# Patient Record
Sex: Female | Born: 2000 | Race: Black or African American | Hispanic: No | Marital: Single | Smoking: Former smoker
Health system: Southern US, Community
[De-identification: ages and names within clinical notes are randomized; demographics above are authoritative.]

## PROBLEM LIST (undated history)

## (undated) ENCOUNTER — Inpatient Hospital Stay (HOSPITAL_COMMUNITY): Payer: Self-pay

## (undated) VITALS — BP 97/50 | HR 102 | Temp 98.1°F | Resp 16 | Ht 65.04 in | Wt 133.6 lb

## (undated) DIAGNOSIS — R41840 Attention and concentration deficit: Secondary | ICD-10-CM

## (undated) DIAGNOSIS — F32A Depression, unspecified: Secondary | ICD-10-CM

## (undated) DIAGNOSIS — R51 Headache: Secondary | ICD-10-CM

## (undated) DIAGNOSIS — J45909 Unspecified asthma, uncomplicated: Secondary | ICD-10-CM

## (undated) DIAGNOSIS — Z3493 Encounter for supervision of normal pregnancy, unspecified, third trimester: Secondary | ICD-10-CM

## (undated) DIAGNOSIS — F329 Major depressive disorder, single episode, unspecified: Secondary | ICD-10-CM

## (undated) DIAGNOSIS — F319 Bipolar disorder, unspecified: Secondary | ICD-10-CM

## (undated) HISTORY — PX: TONSILLECTOMY: SUR1361

## (undated) HISTORY — PX: OTHER SURGICAL HISTORY: SHX169

---
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
## 2018-01-01 ENCOUNTER — Inpatient Hospital Stay (HOSPITAL_COMMUNITY)
Admission: AD | Admit: 2018-01-01 | Discharge: 2018-01-01 | Discharge status: Against Medical Advice | Payer: Medicaid Other | Source: Ambulatory Visit | Attending: Obstetrics and Gynecology | Admitting: Obstetrics and Gynecology

## 2018-01-01 DIAGNOSIS — R109 Unspecified abdominal pain: Secondary | ICD-10-CM | POA: Insufficient documentation

## 2018-01-01 DIAGNOSIS — Z5321 Procedure and treatment not carried out due to patient leaving prior to being seen by health care provider: Secondary | ICD-10-CM | POA: Diagnosis not present

## 2018-01-01 LAB — URINALYSIS, ROUTINE W REFLEX MICROSCOPIC
BACTERIA UA: NONE SEEN
Bilirubin Urine: NEGATIVE
Glucose, UA: NEGATIVE mg/dL
Hgb urine dipstick: NEGATIVE
Ketones, ur: NEGATIVE mg/dL
NITRITE: NEGATIVE
Protein, ur: NEGATIVE mg/dL
SPECIFIC GRAVITY, URINE: 1.021 (ref 1.005–1.030)
pH: 6 (ref 5.0–8.0)

## 2018-01-01 NOTE — MAU Note (Signed)
Pt called back to a room but was not in lobby

## 2018-01-01 NOTE — MAU Note (Signed)
Pt called, not in lobby 

## 2018-01-01 NOTE — MAU Note (Signed)
Abdominal pain for 2 days, worse today, dull ache, 6/10, intermittent  Some bloody discharge last night  Would like to be checked for STI  LMP 10/18/17

## 2018-01-24 ENCOUNTER — Telehealth (HOSPITAL_COMMUNITY): Payer: Self-pay

## 2018-01-24 ENCOUNTER — Encounter (HOSPITAL_COMMUNITY): Payer: Self-pay

## 2018-01-24 NOTE — Telephone Encounter (Signed)
Pt. Came in for proof of preg. letter

## 2018-03-01 NOTE — L&D Delivery Note (Signed)
Delivery Note At 3:27 PM a viable female was delivered via Vaginal, Spontaneous (Presentation: direct OA ).  APGAR: pending weight 5 lb 11.4 oz (2590 g).   Placenta status:spontaneous ,intact.  Clot on maternal side of placenta, fluid moderately blood tinged on deliver.  Cord:  with the following complications: none .  Cord pH: n/a  Anesthesia:   Episiotomy: None Lacerations: Labial, right,hemostatic Suture Repair: NOne Est. Blood Loss (mL): 100  Mom to postpartum.  Baby to NICU.  Baby skin to skin while awaiting delayed cord clamping. Baby was vigorous with a strong cry at delivery. Passed off to awaiting NICU team.  Geryl Rankins 06/20/2018, 3:51 PM

## 2018-03-03 ENCOUNTER — Telehealth: Payer: Self-pay | Admitting: Licensed Clinical Social Worker

## 2018-03-03 NOTE — Telephone Encounter (Signed)
Unable to leave voicemail.

## 2018-03-06 ENCOUNTER — Telehealth: Payer: Self-pay | Admitting: Licensed Clinical Social Worker

## 2018-03-06 NOTE — Telephone Encounter (Signed)
Unable to leave message

## 2018-03-14 ENCOUNTER — Telehealth: Payer: Self-pay | Admitting: Licensed Clinical Social Worker

## 2018-03-14 NOTE — Telephone Encounter (Signed)
Pt called. No message left. Voicemail unavailable.

## 2018-03-21 ENCOUNTER — Encounter: Payer: Medicaid Other | Admitting: Student

## 2018-04-03 ENCOUNTER — Telehealth: Payer: Self-pay | Admitting: Licensed Clinical Social Worker

## 2018-04-03 NOTE — Telephone Encounter (Signed)
Called pt regarding two no show appts unable to leave message

## 2018-05-01 ENCOUNTER — Other Ambulatory Visit: Payer: Self-pay

## 2018-05-01 ENCOUNTER — Inpatient Hospital Stay (HOSPITAL_COMMUNITY)
Admission: AD | Admit: 2018-05-01 | Discharge: 2018-05-01 | Disposition: A | Discharge status: Home / Self Care | Payer: Medicaid Other | Attending: Obstetrics and Gynecology | Admitting: Obstetrics and Gynecology

## 2018-05-01 ENCOUNTER — Encounter (HOSPITAL_COMMUNITY): Payer: Self-pay | Admitting: *Deleted

## 2018-05-01 DIAGNOSIS — B9689 Other specified bacterial agents as the cause of diseases classified elsewhere: Secondary | ICD-10-CM | POA: Insufficient documentation

## 2018-05-01 DIAGNOSIS — R6889 Other general symptoms and signs: Secondary | ICD-10-CM

## 2018-05-01 DIAGNOSIS — B379 Candidiasis, unspecified: Secondary | ICD-10-CM

## 2018-05-01 DIAGNOSIS — B373 Candidiasis of vulva and vagina: Secondary | ICD-10-CM | POA: Diagnosis not present

## 2018-05-01 DIAGNOSIS — Z87891 Personal history of nicotine dependence: Secondary | ICD-10-CM | POA: Diagnosis not present

## 2018-05-01 DIAGNOSIS — R824 Acetonuria: Secondary | ICD-10-CM | POA: Diagnosis not present

## 2018-05-01 DIAGNOSIS — O23592 Infection of other part of genital tract in pregnancy, second trimester: Secondary | ICD-10-CM | POA: Diagnosis not present

## 2018-05-01 DIAGNOSIS — O98812 Other maternal infectious and parasitic diseases complicating pregnancy, second trimester: Secondary | ICD-10-CM | POA: Diagnosis not present

## 2018-05-01 DIAGNOSIS — R05 Cough: Secondary | ICD-10-CM | POA: Diagnosis present

## 2018-05-01 DIAGNOSIS — O99512 Diseases of the respiratory system complicating pregnancy, second trimester: Secondary | ICD-10-CM | POA: Insufficient documentation

## 2018-05-01 DIAGNOSIS — O26892 Other specified pregnancy related conditions, second trimester: Secondary | ICD-10-CM

## 2018-05-01 DIAGNOSIS — Z3689 Encounter for other specified antenatal screening: Secondary | ICD-10-CM

## 2018-05-01 DIAGNOSIS — J101 Influenza due to other identified influenza virus with other respiratory manifestations: Secondary | ICD-10-CM | POA: Diagnosis not present

## 2018-05-01 DIAGNOSIS — Z3A26 26 weeks gestation of pregnancy: Secondary | ICD-10-CM | POA: Insufficient documentation

## 2018-05-01 DIAGNOSIS — N760 Acute vaginitis: Secondary | ICD-10-CM

## 2018-05-01 LAB — WET PREP, GENITAL
SPERM: NONE SEEN
Trich, Wet Prep: NONE SEEN

## 2018-05-01 LAB — URINALYSIS, ROUTINE W REFLEX MICROSCOPIC
BILIRUBIN URINE: NEGATIVE
GLUCOSE, UA: NEGATIVE mg/dL
HGB URINE DIPSTICK: NEGATIVE
Ketones, ur: 20 mg/dL — AB
Nitrite: NEGATIVE
PH: 6 (ref 5.0–8.0)
Protein, ur: NEGATIVE mg/dL
Specific Gravity, Urine: 1.019 (ref 1.005–1.030)

## 2018-05-01 LAB — INFLUENZA PANEL BY PCR (TYPE A & B)
Influenza A By PCR: POSITIVE — AB
Influenza B By PCR: NEGATIVE

## 2018-05-01 MED ORDER — SALINE SPRAY 0.65 % NA SOLN: 1.0000 | NASAL | 0 refills | Status: DC | PRN

## 2018-05-01 MED ORDER — TERCONAZOLE 0.4 % VA CREA: 1.0000 | TOPICAL_CREAM | Freq: Every day | VAGINAL | 0 refills | Status: DC

## 2018-05-01 MED ORDER — METRONIDAZOLE 500 MG PO TABS: 500.0000 mg | ORAL_TABLET | Freq: Two times a day (BID) | ORAL | 0 refills | Status: DC

## 2018-05-01 MED ORDER — LACTATED RINGERS IV BOLUS
1000.0000 mL | Freq: Once | INTRAVENOUS | Status: AC
  Administered 2018-05-01: 1000 mL via INTRAVENOUS

## 2018-05-01 MED ORDER — MENTHOL 3 MG MT LOZG
1.0000 | LOZENGE | OROMUCOSAL | Status: DC | PRN
  Administered 2018-05-01: 3 mg via ORAL
  Filled 2018-05-01: qty 9

## 2018-05-01 MED ORDER — ACETAMINOPHEN 325 MG PO TABS: 650.0000 mg | ORAL_TABLET | Freq: Four times a day (QID) | ORAL | 0 refills | Status: DC | PRN

## 2018-05-01 MED ORDER — ACETAMINOPHEN 500 MG PO TABS
1000.0000 mg | ORAL_TABLET | Freq: Once | ORAL | Status: AC
  Administered 2018-05-01: 1000 mg via ORAL
  Filled 2018-05-01: qty 2

## 2018-05-01 MED ORDER — ONDANSETRON HCL 4 MG PO TABS: 4.0000 mg | ORAL_TABLET | Freq: Three times a day (TID) | ORAL | 0 refills | Status: DC | PRN

## 2018-05-01 MED ORDER — ONDANSETRON 4 MG PO TBDP
4.0000 mg | ORAL_TABLET | Freq: Once | ORAL | Status: AC
  Administered 2018-05-01: 4 mg via ORAL
  Filled 2018-05-01: qty 1

## 2018-05-01 MED ORDER — MENTHOL 3 MG MT LOZG: 1.0000 | LOZENGE | OROMUCOSAL | 0 refills | Status: DC | PRN

## 2018-05-01 MED ORDER — OSELTAMIVIR PHOSPHATE 75 MG PO CAPS: 75.0000 mg | ORAL_CAPSULE | Freq: Two times a day (BID) | ORAL | 0 refills | Status: DC

## 2018-05-01 NOTE — MAU Note (Signed)
Pt reports she has been coughing since yesterday, body aches, sore throat, congestion.

## 2018-05-01 NOTE — MAU Provider Note (Signed)
History     CSN: [id number]  Arrival date and time: 05/01/18 1115   First Provider Initiated Contact with Patient 05/01/18 1208      Chief Complaint  Patient presents with  . Cough  . Sore Throat  . Generalized Body Aches  . Nasal Congestion   HPI Laura Callahan is a 18 y.o. [phone] at 2041w6d who presents to MAU with chief complaints of dry cough, body aches, sore throat and congestion. These are new problems, occurring together, onset last night. She did not receive a flu shot this season. She denies recent exposure to ill people.  Patient endorses generalized abdominal pain which coincides with episodes of coughing. She endorses pain rating of 8/10. She denies aggravating or alleviating factors. She has not taken medication or tried other treatments for this pain. She denies vaginal bleeding, leaking of fluid, decreased fetal movement, fever, falls, or recent illness.     OB History    Gravida  4   Para  1   Term  1   Preterm  0   AB  2   Living  1     SAB  2   TAB  0   Ectopic  0   Multiple  0   Live Births  1           Past Medical History:  Diagnosis Date  . Asthma   . Attention deficit   . Bipolar 1 disorder (HCC)   . Depression   . Headache(784.0)   . Normal pregnancy in third trimester 08/27/2016  . SVD (spontaneous vaginal delivery) 08/27/2016    Past Surgical History:  Procedure Laterality Date  . addenoidectomy    . TONSILLECTOMY      Family History  Problem Relation Age of Onset  . Depression Sister   . Depression Mother   . Anxiety disorder Mother   . Drug abuse Mother   . Anxiety disorder Sister   . Drug abuse Cousin     Social History   Tobacco Use  . Smoking status: Former Smoker    Packs/day: 0.00    Last attempt to quit: 12/02/2017    Years since quitting: 0.4  . Smokeless tobacco: Never Used  Substance Use Topics  . Alcohol use: No  . Drug use: Not Currently    Types: Marijuana    Comment: last used Jan 2020     Allergies:  Allergies  Allergen Reactions  . Flax Seed Oil [Bio-Flax] Swelling    blisters  . Clonidine Derivatives Nausea Only    Stomach pain  . Red Dye Nausea And Vomiting  . Codeine Nausea And Vomiting and Other (See Comments)    Blisters as a child- tolerates percocet and other pain medications without issue    Medications Prior to Admission  Medication Sig Dispense Refill Last Dose  . ibuprofen (ADVIL,MOTRIN) 600 MG tablet Take 1 tablet (600 mg total) by mouth every 6 (six) hours as needed. 40 tablet 1   . Prenatal Vit-Fe Fumarate-FA (PRENATAL MULTIVITAMIN) TABS tablet Take 1 tablet by mouth daily at 12 noon.   08/25/2016 at Unknown time  . prochlorperazine (COMPAZINE) 10 MG tablet Take 1 tablet (10 mg total) by mouth 2 (two) times daily as needed for nausea or vomiting. 20 tablet 0   . scopolamine (TRANSDERM-SCOP) 1 MG/3DAYS Place 1 patch (1.5 mg total) onto the skin every 3 (three) days. 10 patch 12     Review of Systems  Constitutional: Positive for fatigue.  Negative for chills and fever.  Respiratory: Positive for cough. Negative for shortness of breath.   Gastrointestinal: Positive for abdominal pain. Negative for nausea and vomiting.  Genitourinary: Negative for difficulty urinating, dyspareunia, dysuria, flank pain, vaginal bleeding, vaginal discharge and vaginal pain.  Musculoskeletal: Positive for myalgias.  Neurological: Negative for syncope, weakness and headaches.  All other systems reviewed and are negative.  Physical Exam   Blood pressure 113/75, pulse (!) 121, temperature 99.3 F (37.4 C), temperature source Oral, resp. rate 19, height  (1.676 m), weight 63 kg, last menstrual period 10/25/2017, SpO2 100 %, unknown if currently breastfeeding.  Physical Exam  Nursing note and vitals reviewed. Constitutional: She is oriented to person, place, and time. She appears well-developed and well-nourished.  Cardiovascular: Normal rate.  Respiratory: Effort  normal and breath sounds normal. No respiratory distress. She has no wheezes. She has no rales. She exhibits no tenderness.  GI: Soft. She exhibits no distension. There is no abdominal tenderness. There is no rebound, no guarding and no CVA tenderness.  Gravid  Genitourinary:    Uterus normal.  Cervix exhibits discharge. Cervix exhibits no motion tenderness and no friability.  Neurological: She is alert and oriented to person, place, and time.  Skin: Skin is warm and dry.  Psychiatric: She has a normal mood and affect. Her behavior is normal. Judgment and thought content normal.    MAU Course/MDM  Procedures: sterile speculum exam  --[facility] prenatal records scanned into media tab, dated 02/09/2018. Reviewed --Fetal tachycardia resolved with 1L IV fluid bolus.  --Now Category I: baseline 145, moderate variability, positive accelerations, no decels --Toco: brief periods of uterine irritability --Symptoms resolving with medications given in MAU. Abdominal pain rated as 3/10 at discharge  Patient Vitals for the past 24 hrs:  BP Temp Temp src Pulse Resp SpO2 Height Weight  05/01/18 1424 (!) 95/46 98.2 F (36.8 C) Oral 92 18 - - -  05/01/18 1147 113/75 99.3 F (37.4 C) Oral (!) 121 19 100 %  (1.676 m) 63 kg    Orders Placed This Encounter  Procedures  . Wet prep, genital    Standing Status:   Standing    Number of Occurrences:   1  . Culture, OB Urine    Standing Status:   Standing    Number of Occurrences:   1    Order Specific Question:   Patient immune status    Answer:   Normal  . Urinalysis, Routine w reflex microscopic    Standing Status:   Standing    Number of Occurrences:   1  . Influenza panel by PCR (type A & B)    Standing Status:   Standing    Number of Occurrences:   1  . Droplet Isolation    Standing Status:   Standing    Number of Occurrences:   1  . Insert peripheral IV    Standing Status:   Standing    Number of Occurrences:   1  . Discharge patient     Order Specific Question:   Discharge disposition    Answer:   01-Home or Self Care [1]    Order Specific Question:   Discharge patient date    Answer:   05/01/2018   Results for orders placed or performed during the hospital encounter of 05/01/18 (from the past 24 hour(s))  Urinalysis, Routine w reflex microscopic     Status: Abnormal   Collection Time: 05/01/18 12:13 PM  Result Value Ref Range   Color, Urine YELLOW YELLOW   APPearance HAZY (A) CLEAR   Specific Gravity, Urine 1.019 1.005 - 1.030   pH 6.0 5.0 - 8.0   Glucose, UA NEGATIVE NEGATIVE mg/dL   Hgb urine dipstick NEGATIVE NEGATIVE   Bilirubin Urine NEGATIVE NEGATIVE   Ketones, ur 20 (A) NEGATIVE mg/dL   Protein, ur NEGATIVE NEGATIVE mg/dL   Nitrite NEGATIVE NEGATIVE   Leukocytes,Ua SMALL (A) NEGATIVE   RBC / HPF 0-5 0 - 5 RBC/hpf   WBC, UA 6-10 0 - 5 WBC/hpf   Bacteria, UA RARE (A) NONE SEEN   Squamous Epithelial / LPF 0-5 0 - 5   Mucus PRESENT   Wet prep, genital     Status: Abnormal   Collection Time: 05/01/18 12:23 PM  Result Value Ref Range   Yeast Wet Prep HPF POC PRESENT (A) NONE SEEN   Trich, Wet Prep NONE SEEN NONE SEEN   Clue Cells Wet Prep HPF POC PRESENT (A) NONE SEEN   WBC, Wet Prep HPF POC MANY (A) NONE SEEN   Sperm NONE SEEN   Influenza panel by PCR (type A & B)     Status: Abnormal   Collection Time: 05/01/18 12:25 PM  Result Value Ref Range   Influenza A By PCR POSITIVE (A) NEGATIVE   Influenza B By PCR NEGATIVE NEGATIVE    Meds ordered this encounter  Medications  . lactated ringers bolus 1,000 mL  . menthol-cetylpyridinium (CEPACOL) lozenge 3 mg  . acetaminophen (TYLENOL) tablet 1,000 mg    Patient states she has previously taken Tylenol without difficulty  . ondansetron (ZOFRAN-ODT) disintegrating tablet 4 mg  . menthol-cetylpyridinium (CEPACOL) 3 MG lozenge    Sig: Take 1 lozenge (3 mg total) by mouth as needed for sore throat.    Dispense:  100 tablet    Refill:  0    Order Specific  Question:   Supervising Provider    Answer:   Reva Bores [[id number]]  . acetaminophen (TYLENOL) 325 MG tablet    Sig: Take 2 tablets (650 mg total) by mouth every 6 (six) hours as needed for moderate pain.    Dispense:  30 tablet    Refill:  0    Order Specific Question:   Supervising Provider    Answer:   Reva Bores [[id number]]  . sodium chloride (OCEAN) 0.65 % SOLN nasal spray    Sig: Place 1 spray into both nostrils as needed for congestion.    Dispense:  1 Bottle    Refill:  0    Order Specific Question:   Supervising Provider    Answer:   Reva Bores [[id number]]  . terconazole (TERAZOL 7) 0.4 % vaginal cream    Sig: Place 1 applicator vaginally at bedtime. Use for seven days    Dispense:  45 g    Refill:  0    Order Specific Question:   Supervising Provider    Answer:   Reva Bores [[id number]]  . metroNIDAZOLE (FLAGYL) 500 MG tablet    Sig: Take 1 tablet (500 mg total) by mouth 2 (two) times daily.    Dispense:  14 tablet    Refill:  0    Order Specific Question:   Supervising Provider    Answer:   Reva Bores [[id number]]  . oseltamivir (TAMIFLU) 75 MG capsule    Sig: Take 1 capsule (75 mg total) by mouth every 12 (twelve) hours.    Dispense:  10 capsule    Refill:  0    Order Specific Question:   Supervising Provider    Answer:   Reva Bores [[id number]]  . ondansetron (ZOFRAN) 4 MG tablet    Sig: Take 1 tablet (4 mg total) by mouth every 8 (eight) hours as needed for nausea or vomiting.    Dispense:  20 tablet    Refill:  0    Order Specific Question:   Supervising Provider    Answer:   Reva Bores [[id number]]    Assessment and Plan  --18 y.o. G4P1021 at [redacted]w[redacted]d  --Reactive tracing, closed cervix --Positive Flu A --Bacterial Vaginosis, Yeast infection --Home rx for comfort management, Tamiflu --Discharge home in stable condition  Laura Callahan, CNM 05/01/2018, 2:32 PM

## 2018-05-01 NOTE — Discharge Instructions (Signed)

## 2018-05-02 LAB — CULTURE, OB URINE: SPECIAL REQUESTS: NORMAL

## 2018-05-20 IMAGING — US US PELVIS COMPLETE
1 series · 15 of 25 positions shown · non-contrast
Comparison: None

CLINICAL DATA: Recent spontaneous abortion with persistent pelvic
pain and discharge, initial encounter



[Series 1: us pelvis complete · 15 of 41 slices shown]
[im 1/41]
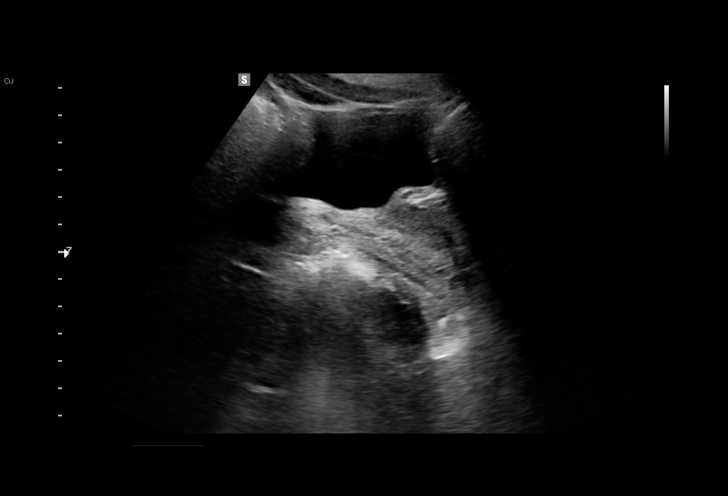
[im 4/41]
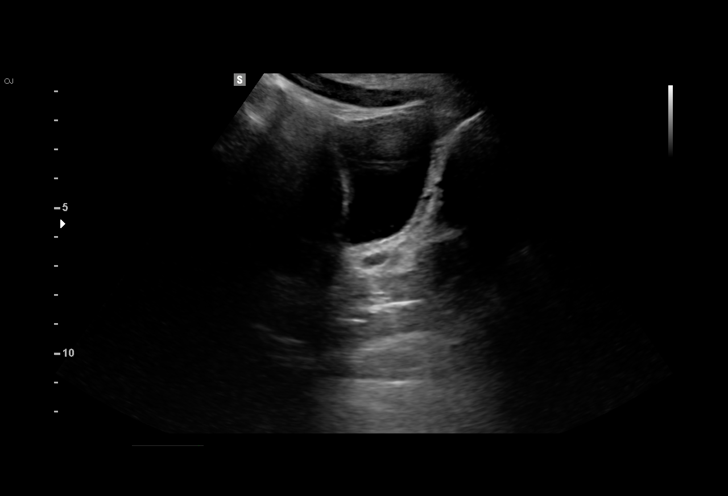
[im 7/41]
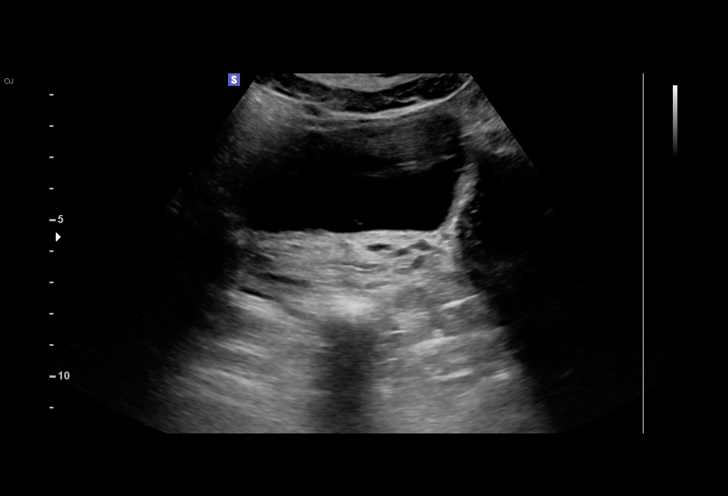
[im 9/41]
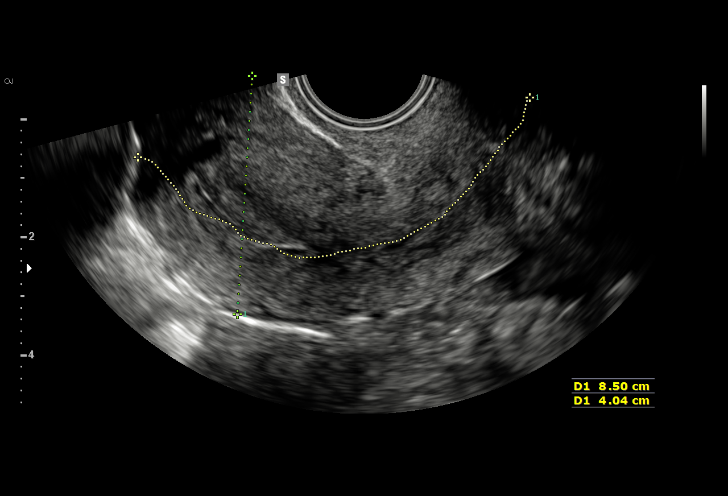
[im 12/41]
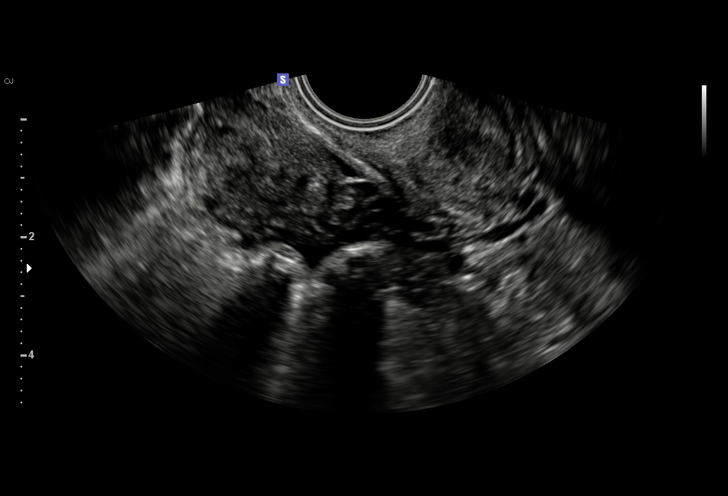
[im 16/41]
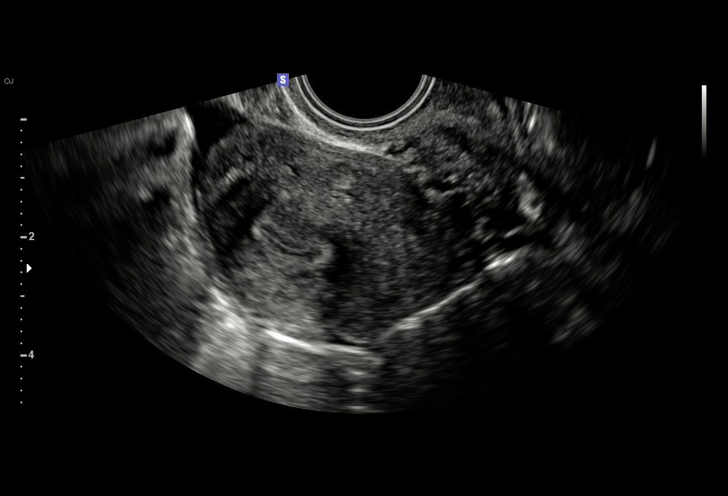
[im 17/41]
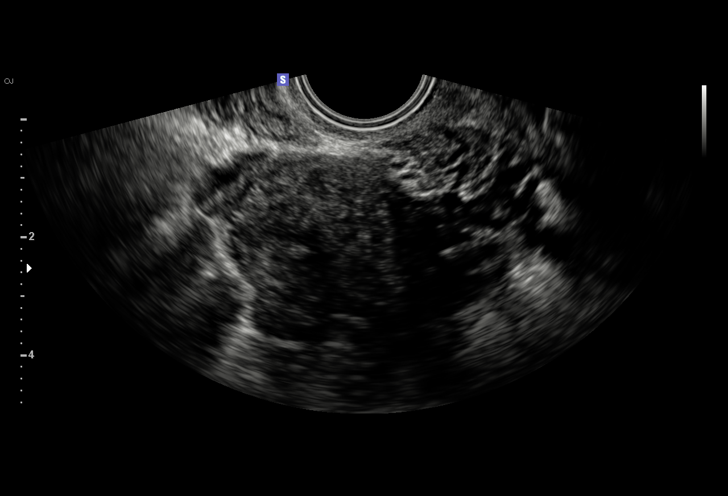
[im 21/41]
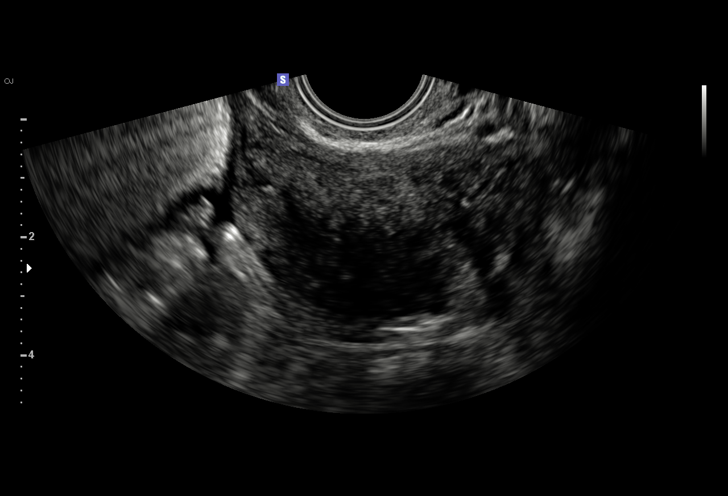
[im 24/41]
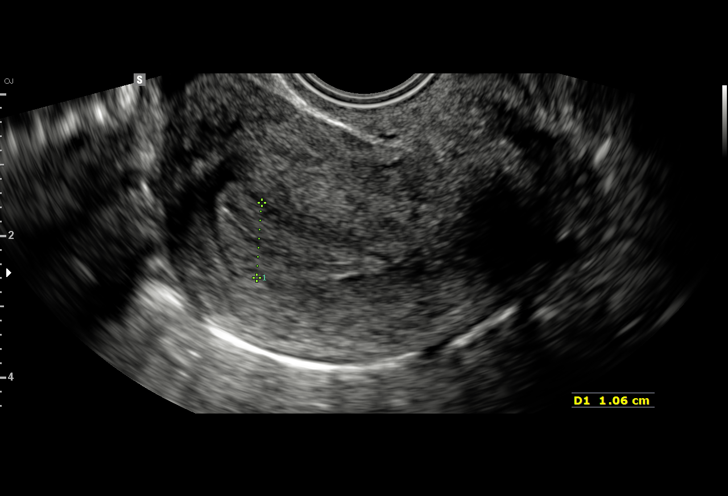
[im 26/41]
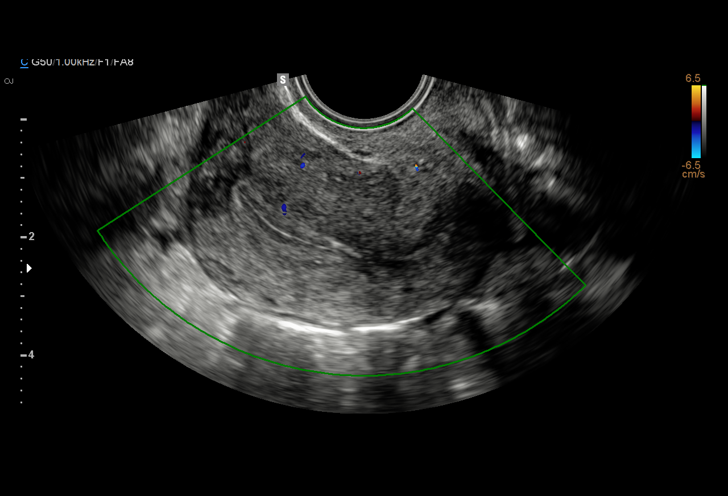
[im 29/41]
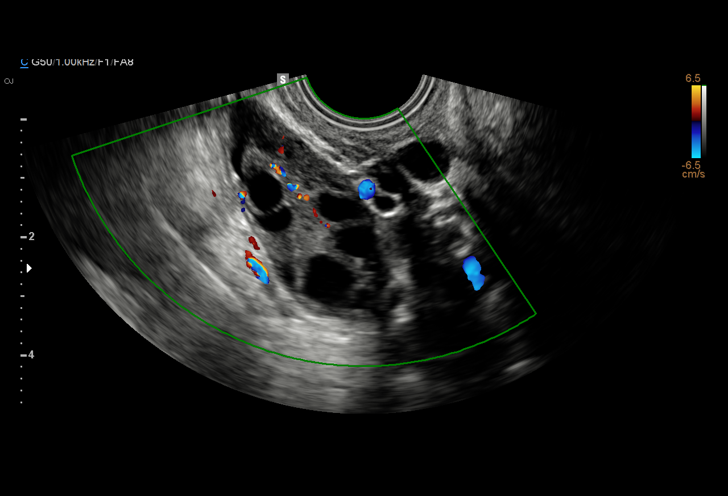
[im 32/41]
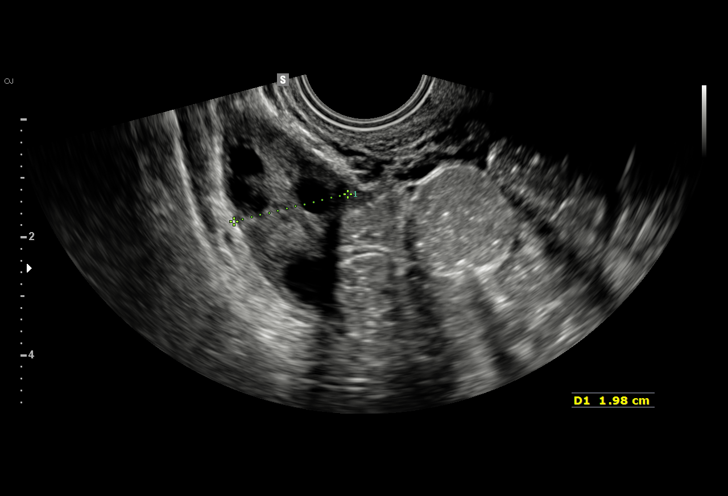
[im 34/41]
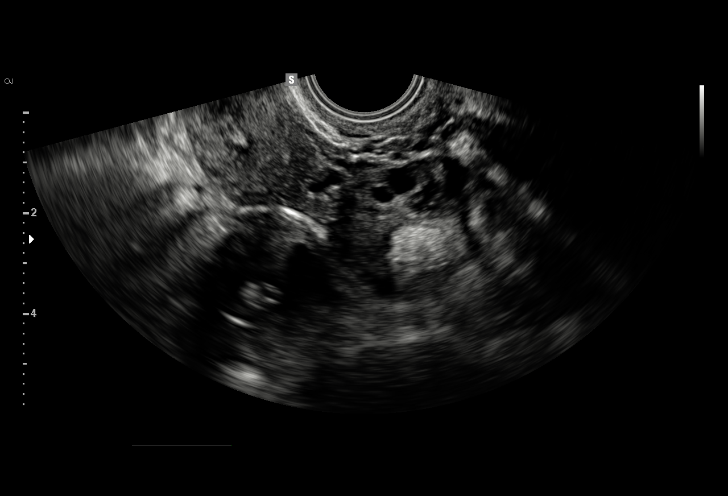
[im 37/41]
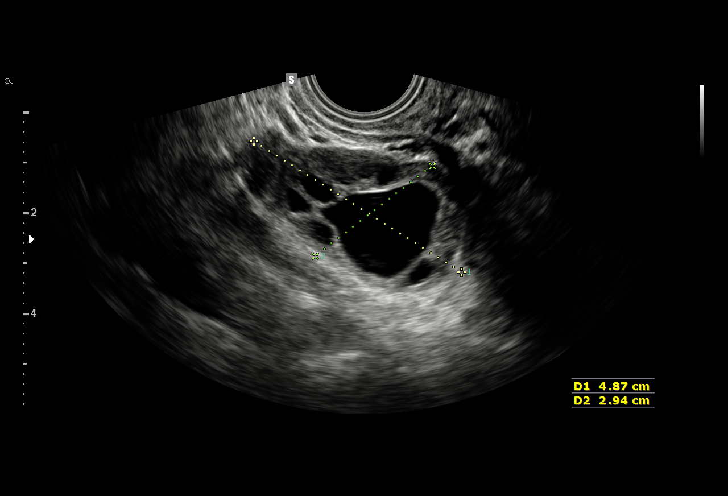
[im 41/41]
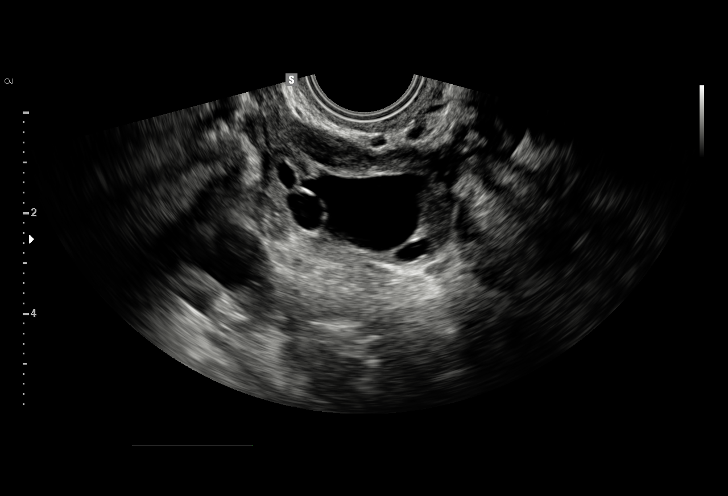

[15 of 25 positions shown; findings below may reference images not displayed]

FINDINGS: Uterus

Measurements: 8.5 x 4.0 x 5.4 cm pre. No fibroids or other mass
visualized.

Endometrium

Thickness: 11 mm..  No focal abnormality visualized.

Right ovary

Measurements: 4.3 x 2.1 x 2.0 cm.. Follicular changes are noted.

Left ovary

Measurements: 4.8 x 2.9 x 3.4 cm.. Follicular changes are noted. A
dominant 2.7 cm cyst is noted on the left.

Other findings

Trace free fluid is noted.  This may be physiologic in nature.
IMPRESSION: No acute abnormality is noted.

Left ovarian cyst.  No other focal abnormality is seen.

## 2018-06-16 ENCOUNTER — Encounter (HOSPITAL_COMMUNITY): Payer: Self-pay | Admitting: *Deleted

## 2018-06-16 ENCOUNTER — Inpatient Hospital Stay (HOSPITAL_COMMUNITY)
Admission: AD | Admit: 2018-06-16 | Discharge: 2018-06-16 | DRG: 833 | Discharge status: Against Medical Advice | Payer: Medicaid Other | Attending: Obstetrics & Gynecology | Admitting: Obstetrics & Gynecology

## 2018-06-16 ENCOUNTER — Encounter (HOSPITAL_COMMUNITY): Payer: Self-pay | Admitting: Certified Registered Nurse Anesthetist

## 2018-06-16 ENCOUNTER — Other Ambulatory Visit: Payer: Self-pay

## 2018-06-16 DIAGNOSIS — O4703 False labor before 37 completed weeks of gestation, third trimester: Secondary | ICD-10-CM | POA: Diagnosis not present

## 2018-06-16 DIAGNOSIS — O99333 Smoking (tobacco) complicating pregnancy, third trimester: Secondary | ICD-10-CM | POA: Diagnosis present

## 2018-06-16 DIAGNOSIS — F17210 Nicotine dependence, cigarettes, uncomplicated: Secondary | ICD-10-CM | POA: Diagnosis present

## 2018-06-16 DIAGNOSIS — R8789 Other abnormal findings in specimens from female genital organs: Secondary | ICD-10-CM

## 2018-06-16 DIAGNOSIS — Z3A33 33 weeks gestation of pregnancy: Secondary | ICD-10-CM | POA: Diagnosis not present

## 2018-06-16 DIAGNOSIS — Z5329 Procedure and treatment not carried out because of patient's decision for other reasons: Secondary | ICD-10-CM

## 2018-06-16 DIAGNOSIS — O09899 Supervision of other high risk pregnancies, unspecified trimester: Secondary | ICD-10-CM

## 2018-06-16 DIAGNOSIS — Z5320 Procedure and treatment not carried out because of patient's decision for unspecified reasons: Secondary | ICD-10-CM

## 2018-06-16 LAB — WET PREP, GENITAL
Clue Cells Wet Prep HPF POC: NONE SEEN
Sperm: NONE SEEN
Trich, Wet Prep: NONE SEEN
Yeast Wet Prep HPF POC: NONE SEEN

## 2018-06-16 LAB — URINALYSIS, ROUTINE W REFLEX MICROSCOPIC
Bilirubin Urine: NEGATIVE
Glucose, UA: NEGATIVE mg/dL
Hgb urine dipstick: NEGATIVE
Ketones, ur: 20 mg/dL — AB
Nitrite: NEGATIVE
Protein, ur: 30 mg/dL — AB
Specific Gravity, Urine: 1.017 (ref 1.005–1.030)
WBC, UA: >50 WBC/hpf — ABNORMAL HIGH (ref 0–5)
pH: 7 (ref 5.0–8.0)

## 2018-06-16 LAB — TYPE AND SCREEN
ABO/RH(D): A POS
Antibody Screen: NEGATIVE

## 2018-06-16 LAB — FETAL FIBRONECTIN: Fetal Fibronectin: POSITIVE — AB

## 2018-06-16 MED ORDER — LACTATED RINGERS IV BOLUS
500.0000 mL | Freq: Once | INTRAVENOUS | Status: AC
  Administered 2018-06-16: 17:00:00 500 mL via INTRAVENOUS

## 2018-06-16 MED ORDER — BETAMETHASONE SOD PHOS & ACET 6 (3-3) MG/ML IJ SUSP
12.0000 mg | Freq: Once | INTRAMUSCULAR | Status: AC
  Administered 2018-06-16: 16:00:00 12 mg via INTRAMUSCULAR
  Filled 2018-06-16: qty 2

## 2018-06-16 MED ORDER — BETAMETHASONE SOD PHOS & ACET 6 (3-3) MG/ML IJ SUSP: 12.0000 mg | Freq: Once | INTRAMUSCULAR | Status: DC

## 2018-06-16 MED ORDER — NIFEDIPINE 10 MG PO CAPS: 10.0000 mg | ORAL_CAPSULE | ORAL | Status: DC | PRN

## 2018-06-16 MED ORDER — NIFEDIPINE 10 MG PO CAPS
10.0000 mg | ORAL_CAPSULE | Freq: Once | ORAL | Status: AC
  Administered 2018-06-16: 10 mg via ORAL
  Filled 2018-06-16: qty 1

## 2018-06-16 MED ORDER — NIFEDIPINE 10 MG PO CAPS
10.0000 mg | ORAL_CAPSULE | ORAL | Status: DC | PRN
  Administered 2018-06-16 (×2): 10 mg via ORAL
  Filled 2018-06-16 (×3): qty 1

## 2018-06-16 MED ORDER — OXYCODONE-ACETAMINOPHEN 5-325 MG PO TABS
1.0000 | ORAL_TABLET | Freq: Once | ORAL | Status: AC
  Administered 2018-06-16: 1 via ORAL
  Filled 2018-06-16: qty 1

## 2018-06-16 MED ORDER — ACETAMINOPHEN 325 MG PO TABS
650.0000 mg | ORAL_TABLET | ORAL | Status: DC | PRN
  Administered 2018-06-16: 21:00:00 650 mg via ORAL
  Filled 2018-06-16: qty 2

## 2018-06-16 MED ORDER — LACTATED RINGERS IV SOLN
INTRAVENOUS | Status: DC
  Administered 2018-06-16: 125 mL/h via INTRAVENOUS
  Administered 2018-06-16: 16:00:00 via INTRAVENOUS

## 2018-06-16 MED ORDER — LACTATED RINGERS IV BOLUS
500.0000 mL | Freq: Once | INTRAVENOUS | Status: AC
  Administered 2018-06-16: 14:00:00 500 mL via INTRAVENOUS

## 2018-06-16 MED ORDER — LACTATED RINGERS IV SOLN: INTRAVENOUS | Status: DC

## 2018-06-16 MED ORDER — FERROUS GLUCONATE 324 (38 FE) MG PO TABS: 324.0000 mg | ORAL_TABLET | Freq: Every day | ORAL | Status: DC

## 2018-06-16 NOTE — Progress Notes (Signed)
Patient requesting to leave AMA, [facility], CNM notified x 2, stated will be in to talk to patient.  Demanded IV to be removed. IV removed.Laura Callahan

## 2018-06-16 NOTE — MAU Provider Note (Signed)
History     CSN: [id number]  Arrival date and time: 06/16/18 1214   First Provider Initiated Contact with Patient 06/16/18 1327      Chief Complaint  Patient presents with  . Contractions  . Vaginal Bleeding   Laura Callahan is a 18 y.o. [id number] at [redacted]w[redacted]d who presents for Contractions and Vaginal Bleeding.  She states she started having contractions last night around midnight, but was able to sleep last night,  Patient states "it feels like I am cramping really bad."  She reports having some vaginal bleeding with wiping that was "red with some green mucous."  She endorses fetal movement and denies LOF. She reports "lower stomach pains that are coming and going."  She rates them at a 8/10.  She denies taking anything for the pain.  She denies sexual activity in the past 72 hours.       OB History    Gravida  4   Para  1   Term  1   Preterm  0   AB  2   Living  1     SAB  2   TAB  0   Ectopic  0   Multiple  0   Live Births  1           Past Medical History:  Diagnosis Date  . Asthma   . Attention deficit   . Bipolar 1 disorder (HCC)   . Depression   . Headache(784.0)   . Normal pregnancy in third trimester 08/27/2016  . SVD (spontaneous vaginal delivery) 08/27/2016    Past Surgical History:  Procedure Laterality Date  . addenoidectomy    . TONSILLECTOMY      Family History  Problem Relation Age of Onset  . Depression Sister   . Depression Mother   . Anxiety disorder Mother   . Drug abuse Mother   . Anxiety disorder Sister   . Drug abuse Cousin     Social History   Tobacco Use  . Smoking status: Current Every Day Smoker    Packs/day: 0.00    Last attempt to quit: 12/02/2017    Years since quitting: 0.5  . Smokeless tobacco: Never Used  Substance Use Topics  . Alcohol use: No  . Drug use: Not Currently    Types: Marijuana    Comment: last used Jan 2020    Allergies:  Allergies  Allergen Reactions  . Flax Seed Oil [Bio-Flax]  Swelling    blisters  . Clonidine Derivatives Nausea Only    Stomach pain  . Red Dye Nausea And Vomiting  . Codeine Nausea And Vomiting and Other (See Comments)    Blisters as a child- tolerates percocet and other pain medications without issue    Medications Prior to Admission  Medication Sig Dispense Refill Last Dose  . oseltamivir (TAMIFLU) 75 MG capsule Take 1 capsule (75 mg total) by mouth every 12 (twelve) hours. 10 capsule 0 Past Month at Unknown time  . Prenatal Vit-Fe Fumarate-FA (PRENATAL MULTIVITAMIN) TABS tablet Take 1 tablet by mouth daily at 12 noon.   06/15/2018 at Unknown time  . acetaminophen (TYLENOL) 325 MG tablet Take 2 tablets (650 mg total) by mouth every 6 (six) hours as needed for moderate pain. 30 tablet 0 More than a month at Unknown time  . menthol-cetylpyridinium (CEPACOL) 3 MG lozenge Take 1 lozenge (3 mg total) by mouth as needed for sore throat. 100 tablet 0 More than a month at Unknown time  .  metroNIDAZOLE (FLAGYL) 500 MG tablet Take 1 tablet (500 mg total) by mouth 2 (two) times daily. 14 tablet 0 More than a month at Unknown time  . ondansetron (ZOFRAN) 4 MG tablet Take 1 tablet (4 mg total) by mouth every 8 (eight) hours as needed for nausea or vomiting. 20 tablet 0 More than a month at Unknown time  . prochlorperazine (COMPAZINE) 10 MG tablet Take 1 tablet (10 mg total) by mouth 2 (two) times daily as needed for nausea or vomiting. 20 tablet 0 More than a month at Unknown time  . scopolamine (TRANSDERM-SCOP) 1 MG/3DAYS Place 1 patch (1.5 mg total) onto the skin every 3 (three) days. 10 patch 12 More than a month at Unknown time  . sodium chloride (OCEAN) 0.65 % SOLN nasal spray Place 1 spray into both nostrils as needed for congestion. 1 Bottle 0 More than a month at Unknown time  . terconazole (TERAZOL 7) 0.4 % vaginal cream Place 1 applicator vaginally at bedtime. Use for seven days 45 g 0 More than a month at Unknown time    Review of Systems   Constitutional: Negative for chills and fever.  Respiratory: Negative for cough and shortness of breath.   Gastrointestinal: Positive for abdominal pain. Negative for constipation, diarrhea, nausea and vomiting.  Genitourinary: Positive for vaginal bleeding. Negative for dysuria and vaginal discharge.  Musculoskeletal: Negative for back pain.  Neurological: Negative for dizziness, light-headedness and headaches.   Physical Exam   Blood pressure (!) 108/51, pulse 93, temperature 98.4 F (36.9 C), temperature source Oral, resp. rate 17, weight 66.8 kg, last menstrual period 10/25/2017, SpO2 100 %, unknown if currently breastfeeding. Vitals:   06/16/18 1441 06/16/18 1515  BP: (!) 97/48 112/61  Pulse: (!) 107 76  Resp:    Temp:    SpO2:      Physical Exam  Constitutional: She is oriented to person, place, and time. She appears well-developed and well-nourished. She appears distressed.  HENT:  Head: Normocephalic and atraumatic.  Eyes: Conjunctivae are normal.  Neck: Normal range of motion.  Cardiovascular: Normal rate.  Respiratory: Effort normal.  GI: Soft.  Genitourinary: Cervix exhibits discharge. Cervix exhibits no motion tenderness and no friability.    Vaginal discharge present.     No vaginal bleeding.  No bleeding in the vagina.    Genitourinary Comments: Sterile Speculum Exam: -Vaginal Vault: Pink mucosa. Scant amt thin white mucoid discharge -wet prep collected -Cervix:No lesions, cysts, or polyps noted.  Pink with white plaques.  Appears open, moderate amt yellowish-white mucoid discharge from os-GC/CT collected  -Bimanual Exam: Dilation: 1.5 Effacement (%): 50 Station: -3 Presentation: Vertex   Musculoskeletal: Normal range of motion.        General: No edema.  Neurological: She is alert and oriented to person, place, and time.  Skin: Skin is warm and dry.  Psychiatric: She has a normal mood and affect. Her behavior is normal.    Fetal Assessment 145 bpm, Mod  Var, -Decels, -Accels Toco: Q2-95min, palpates mild to moderate  MAU Course   Results for orders placed or performed during the hospital encounter of 06/16/18 (from the past 24 hour(s))  Urinalysis, Routine w reflex microscopic     Status: Abnormal   Collection Time: 06/16/18  1:26 PM  Result Value Ref Range   Color, Urine YELLOW YELLOW   APPearance CLOUDY (A) CLEAR   Specific Gravity, Urine 1.017 1.005 - 1.030   pH 7.0 5.0 - 8.0   Glucose, UA NEGATIVE  NEGATIVE mg/dL   Hgb urine dipstick NEGATIVE NEGATIVE   Bilirubin Urine NEGATIVE NEGATIVE   Ketones, ur 20 (A) NEGATIVE mg/dL   Protein, ur 30 (A) NEGATIVE mg/dL   Nitrite NEGATIVE NEGATIVE   Leukocytes,Ua LARGE (A) NEGATIVE   RBC / HPF 0-5 0 - 5 RBC/hpf   WBC, UA >50 (H) 0 - 5 WBC/hpf   Bacteria, UA FEW (A) NONE SEEN   Squamous Epithelial / LPF 21-50 0 - 5   Mucus PRESENT   Fetal fibronectin     Status: Abnormal   Collection Time: 06/16/18  1:47 PM  Result Value Ref Range   Fetal Fibronectin POSITIVE (A) NEGATIVE  Wet prep, genital     Status: Abnormal   Collection Time: 06/16/18  1:51 PM  Result Value Ref Range   Yeast Wet Prep HPF POC NONE SEEN NONE SEEN   Trich, Wet Prep NONE SEEN NONE SEEN   Clue Cells Wet Prep HPF POC NONE SEEN NONE SEEN   WBC, Wet Prep HPF POC MANY (A) NONE SEEN   Sperm NONE SEEN    No results found.  MDM PE Labs: UA, Wet prep, GC/CT, and fFN Pain Medication Tocolytics Start IV Fluid Bolus Assessment and Plan  18 year old  G4P1021 at 33.3 weeks Cat I FT Contractions Cervical Dilation  -Exam findings discussed -Informed of need to stop contractions and likelihood of receiving BMZ. -Wet prep, GC/CT, and fFN collected and sent.  -Retake BP and then give Procardia per protcol. -Give one tablet of percocet for pain. -Will continue to monitor and reassess.  Follow Up (3:10 PM) +fFN  -Wet prep returns with insignificant findings. -Results discussed with patient. -Informed that GC/CT will  return within 2-3 days. -Informed of positive fFN and need for BMZ dosing. -Will also give 2nd Procardia now. -Patient reports improvement in symptoms and does not appear distressed. -Discussed potential POC to include admission for observation or discharge with bedrest.  Patient verbalized understanding and without questions or concerns. -Dr. Katharine Look contacted and requests patient to be admitted with standard antenatal orders and repeat BMZ in 24 hours. -Patient updated on POC and continues to have no questions or concerns.  -Orders placed and care released to Dr. Katharine Look.  Cherre Robins MSN, CNM 06/16/2018, 1:27 PM

## 2018-06-16 NOTE — H&P (Signed)
Laura Callahan is a 18 y.o. female, [phone] at 6860w3d admitted for preterm labor.  Starting last night/early this am she noted painful contractions, they seemed to resolve long enough for her to sleep, but then today contractions worsened.  She notes that at some point they were very painful and every 7-3310min.  Since being in MAU, s/p procardia she has noted almost complete resolution of her pain.  Denies LOF, no vaginal bleeding.  Does report losing her mucus plug, +FM  Pregnancy complicated by: -Tobacco use- down to a few cigarettes per day -Non-compliance- has missed appts, but recently has been attending more regularly -teen pregnancy -h/o bipolar disorder- no meds currently, mood has been appropriate throughout pregnancy  OB History    Gravida  4   Para  1   Term  1   Preterm  0   AB  2   Living  1     SAB  2   TAB  0   Ectopic  0   Multiple  0   Live Births  1          Past Medical History:  Diagnosis Date  . Asthma   . Attention deficit   . Bipolar 1 disorder (HCC)   . Depression   . Headache(784.0)   . Normal pregnancy in third trimester 08/27/2016  . SVD (spontaneous vaginal delivery) 08/27/2016   Past Surgical History:  Procedure Laterality Date  . addenoidectomy    . TONSILLECTOMY     Family History: family history includes Anxiety disorder in her mother and sister; Depression in her mother and sister; Drug abuse in her cousin and mother. Social History:  reports that she has been smoking. She has been smoking about 0.00 packs per day. She has never used smokeless tobacco. She reports previous drug use. Drug: Marijuana. She reports that she does not drink alcohol.   Prenatal Transfer Tool  Maternal Diabetes: No Genetic Screening: Normal Maternal Ultrasounds/Referrals: Normal Fetal Ultrasounds or other Referrals:  None Maternal Substance Abuse:  Yes:  Type: Smoker Significant Maternal Medications:  Meds include: Other: iron daily Significant  Maternal Lab Results: GBS unknown  TDAP 05/25/18 Flu declined  ROS:  unremarkable Const: no fever or chills Cardio: no chest pain Resp: no cough, no SOB Abd: no nausea/vomiting, no change in bowel movement GU: Denies abnormal discharge or bleeding Urinary: No pain with urination or blood in urine Musc: no muscle aches or pains    Allergies  Allergen Reactions  . Flax Seed Oil [Bio-Flax] Swelling    blisters  . Clonidine Derivatives Nausea Only    Stomach pain  . Red Dye Nausea And Vomiting  . Codeine Nausea And Vomiting and Other (See Comments)    Blisters as a child- tolerates percocet and other pain medications without issue   O: BP 112/61   Pulse 76   Temp 98.4 F (36.9 C) (Oral)   Resp 17   Wt 66.8 kg   LMP 10/25/2017 (Exact Date)   SpO2 100%   BMI 23.76 kg/m   Gen: no acute distress Neck: normal appearahce CV: RRR Lungs: CTAB Abd; Gravid, soft and non-tender Ext: no edema, no calf tenderness bilaterally Neuro: A&O x 3 Psych: mood appropriate, normal affect  SVE: as below performed by CNM  Dilation: 1.5 Effacement (%): 50 Station: -3 Exam by:: Gerrit HeckJessica Emly, CNM Blood pressure 112/61, pulse 76, temperature 98.4 F (36.9 C), temperature source Oral, resp. rate 17, weight 66.8 kg, last menstrual period 10/25/2017,  SpO2 100 %, unknown if currently breastfeeding.   FHR: Cat. I - 150bpm, moderate variability, no accels, no decels UCs:  Every on arrival, no irregular  Prenatal labs: ABO, Rh:  A positive Antibody:  negative Rubella:   immune RPR:   neg HBsAg:   neg HIV:   neg GBS:  unknown Hgb: 10- on iron daily    Assessment/Plan: 17yo [id number] @ [redacted]w[redacted]d admitted for observation for preterm labor -FWB: Cat. I, plan for intermittent monitoring -Preterm labor:  Plan for BMTZ for lung maturity  Procardia as needed  Continue with IV hydration -Maternal care  PNV daily  Iron daily  Modified bed rest  Tylenol as needed  CCOB to assume care @  5pm DISPO: Plan to watch overnight- if no further cervical change or contractions, plan to discharge tomorrow after 2nd BMTZ shot  Myna Hidalgo, DO [phone] (cell) [phone] (office)

## 2018-06-16 NOTE — Progress Notes (Signed)
Pt states her contractions feel like they are slowing down, still uncomfortable when they come 8/10 on the pain scale.  Holding second dose of procardia per provider for BP 97/48.

## 2018-06-16 NOTE — Progress Notes (Signed)
Labor Progress Note  Per HPI: Laura Callahan is a 18 y.o. female, D1V6160 at 107w3d admitted for preterm labor.  Starting last night/early this am she noted painful contractions, they seemed to resolve long enough for her to sleep, but then today contractions worsened.  She notes that at some point they were very painful and every 7-31min.  Since being in MAU, s/p procardia she has noted almost complete resolution of her pain.  Denies LOF, no vaginal bleeding.  Does report losing her mucus plug, +FM  Subjective: RN called for pt feeling cxt pain again, procardia series given, but cxt still felt after 30 mins of administration.  Patient Active Problem List   Diagnosis Date Noted  . Preterm uterine contractions in third trimester, antepartum 06/16/2018  . Influenza A 05/01/2018  . Normal pregnancy in third trimester 08/27/2016  . SVD (spontaneous vaginal delivery) 08/27/2016  . Indication for care in labor or delivery 08/26/2016  . Hypoglycemia 02/12/2013  . PTSD (post-traumatic stress disorder) 02/09/2012  . ADHD (attention deficit hyperactivity disorder), combined type 02/09/2012  . Conduct disorder, 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   Objective: BP 110/65 (BP Location: Left Arm)   Pulse 77   Temp 98.3 F (36.8 C) (Oral)   Resp 20   Ht 5\' 6"  (1.676 m)   Wt 66.8 kg   LMP 10/25/2017 (Exact Date)   SpO2 99%   BMI 23.76 kg/m  No intake/output data recorded. Total I/O In: 0  Out: 300 [Urine:300] NST: FHR baseline 140 bpm, Variability: moderate, Accelerations:present, Decelerations:  Absent= Cat 1/Reactive with 10x10 and x1 15x15 CTX:  irregular, every 1 in ten minutes with irritability Uterus gravid, soft non tender, moderate to palpate with contractions.   No bleeding or cervical change noted.  SVE:  Dilation: 1.5 Effacement (%): 50 Station: Ballotable Exam by:: [facility], CNM  Assessment:  Laura Callahan is a 18 y.o. female, [phone] at [redacted]w[redacted]d admitted for preterm labor.   Starting last night/early this am she noted painful contractions, they seemed to resolve long enough for her to sleep, but then today contractions worsened.  She notes that at some point they were very painful and every 7-4min.  Since being in MAU, s/p procardia she has noted almost complete resolution of her pain.  Denies LOF, no vaginal bleeding.  Does report losing her mucus plug, +FM. Pt being monitored over night. Procardia given and helped relieve cxt pain, bolus given which helped as well, pot stable, no vaginal leakage or bleeding noted, no cervical change noted.  Patient Active Problem List   Diagnosis Date Noted  . Preterm uterine contractions in third trimester, antepartum 06/16/2018  . Influenza A 05/01/2018  . Normal pregnancy in third trimester 08/27/2016  . SVD (spontaneous vaginal delivery) 08/27/2016  . Indication for care in labor or delivery 08/26/2016  . Hypoglycemia 02/12/2013  . PTSD (post-traumatic stress disorder) 02/09/2012  . ADHD (attention deficit hyperactivity disorder), combined type 02/09/2012  . Conduct disorder, 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   NICHD: Category 1, Reactive NST.   Membranes: Intact, no s/s of infection, +FnN  Fetus: vertex. +FM. Reative NST.   Pre term labor: Cxt continue, procardia and bolus helps. x1 BMX given at 1530 on 3/17  GBS Unknown  Plan: Continue antenatal plan PTL: monitor toco and fetal tracing, procardia 10 mg Q4H PRN over night BMZ second dose due 1530 on 4/18 Plan if cxt stop may be discharged after 2nd dose of BMZ.  GBS: pending [facility] aware of plan  and verbalized agreement.   Dale DurhamJade Marirose Deveney, NP-C, CNM, MSN 06/16/2018. 7:44 PM

## 2018-06-16 NOTE — Progress Notes (Addendum)
Labor Progress Note  Per HPI: Laura Callahan is a 18 y.o. female, [id number] at [redacted]w[redacted]d admitted for preterm labor.  Starting last night/early this am she noted painful contractions, they seemed to resolve long enough for her to sleep, but then today contractions worsened.  She notes that at some point they were very painful and every 7-86min.  Since being in MAU, s/p procardia she has noted almost complete resolution of her pain.  Denies LOF, no vaginal bleeding.  Does report losing her mucus plug, +FM  Subjective: Called by RN to that was upset due to her support person was not able to change, charge nurses involved with incidence, pt angry wants to leave ama, I informed the pt about her risk of leaveing against medical advice which included PTL, fetal demise or maternal demise, pt enodrses, "I know my risk and wants to go home". Pt signed AMA paper.  Pt informed of recommendation are high on staying in house until tomorrow after BMZ.  Patient Active Problem List   Diagnosis Date Noted  . Preterm uterine contractions in third trimester, antepartum 06/16/2018  . Influenza A 05/01/2018  . Normal pregnancy in third trimester 08/27/2016  . SVD (spontaneous vaginal delivery) 08/27/2016  . Indication for care in labor or delivery 08/26/2016  . Hypoglycemia 02/12/2013  . PTSD (post-traumatic stress disorder) 02/09/2012  . ADHD (attention deficit hyperactivity disorder), combined type 02/09/2012  . Conduct disorder, 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   Objective: BP 122/65 (BP Location: Left Arm)   Pulse 82   Temp 98 F (36.7 C)   Resp 20   Ht 5\' 6"  (1.676 m)   Wt 66.8 kg   LMP 10/25/2017 (Exact Date)   SpO2 99%   BMI 23.76 kg/m  No intake/output data recorded. Total I/O In: 1518.7 [P.O.:410; I.V.:1108.7] Out: 450 [Urine:450] NST: FHR baseline 140 bpm, Variability: moderate, Accelerations:present, Decelerations:  Absent= Cat 1/Reactive with 10x10 and x1 15x15 CTX:  irregular, every 1 in ten  minutes with irritability Uterus gravid, soft non tender, moderate to palpate with contractions.   No bleeding or cervical change noted.  SVE:  Dilation: 1.5 Effacement (%): 50 Station: Ballotable Exam by:: [facility], CNM  Assessment:  Laura Callahan is a 18 y.o. female, [phone] at [redacted]w[redacted]d admitted for preterm labor.  Starting last night/early this am she noted painful contractions, they seemed to resolve long enough for her to sleep, but then today contractions worsened.  She notes that at some point they were very painful and every 7-66min.  Since being in MAU, s/p procardia she has noted almost complete resolution of her pain.  Denies LOF, no vaginal bleeding.  Does report losing her mucus plug, +FM. Pt signed out AMA and left with R&B reviewed.  Patient Active Problem List   Diagnosis Date Noted  . Preterm uterine contractions in third trimester, antepartum 06/16/2018  . Influenza A 05/01/2018  . Normal pregnancy in third trimester 08/27/2016  . SVD (spontaneous vaginal delivery) 08/27/2016  . Indication for care in labor or delivery 08/26/2016  . Hypoglycemia 02/12/2013  . PTSD (post-traumatic stress disorder) 02/09/2012  . ADHD (attention deficit hyperactivity disorder), combined type 02/09/2012  . Conduct disorder, 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   NICHD: Category 1, Reactive NST.   Membranes: Intact, no s/s of infection, +FnN  Fetus: vertex. +FM. Reative NST.   Pre term labor: Cxt continue, procardia and bolus helps. x1 BMX given at 1530 on 3/17  GBS Unknown  Pt signed out AMA: See subjective Note  Plan: Go to MAU for BMZ second dose due 1530 on 4/18 F/U with Dr Charlotta Newtonzan on April 22 GBS: pending  [facility] plan and pt status.   Dale DurhamJade [place], NP-C, CNM, MSN 06/16/2018. 11:50 PM

## 2018-06-16 NOTE — Progress Notes (Signed)
Pt c/o contraction pain 6/10, tearful and breathing through cxn.  1655 Dr. Charlotta Newton notified of contractions q6 minutes. Rn to administer Lactated Ringers bolus. If no relief after 20 minutes, Rn to give 10mg  Procardia.  MD made aware of BO 104/49. RN to proceed with procardia if needed.   Will continue to monitor

## 2018-06-16 NOTE — MAU Note (Signed)
Started cramping last night.  Closer stronger today, now every 7-10 min.  Small amt blood/ mucous this morning. No leakage.  Denies recent intercourse or exams

## 2018-06-16 NOTE — Progress Notes (Signed)
Patient left after signing AMA paper, ambulatory to car, refused escort.

## 2018-06-16 NOTE — Progress Notes (Signed)
Dr. Sallye Ober notified of persistent contractions and pain. Rn auscultated a late decel while in the room, MD aware. RN to give 10mg  Procardia if BP >90/50, increase LR to 168ml/hr  Re-evaluate in 

## 2018-06-16 NOTE — Progress Notes (Signed)
[facility] in at this time.

## 2018-06-17 DIAGNOSIS — Z5320 Procedure and treatment not carried out because of patient's decision for unspecified reasons: Secondary | ICD-10-CM

## 2018-06-17 DIAGNOSIS — Z5329 Procedure and treatment not carried out because of patient's decision for other reasons: Secondary | ICD-10-CM

## 2018-06-17 LAB — ABO/RH: ABO/RH(D): A POS

## 2018-06-17 NOTE — Plan of Care (Signed)
  Problem: Education: Goal: Knowledge of disease or condition will improve Outcome: Not Met (Patient left AMA)   Problem: Education: Goal: Knowledge of the prescribed therapeutic regimen will improve Outcome: Not Met (Patient left AMA)   Problem: Clinical Measurements: Goal: Complications related to the disease process, condition or treatment will be avoided or minimized Outcome: Not Met (Patient left AMA)   Problem: Health Behavior/Discharge Planning: Goal: Ability to manage health-related needs will improve Outcome: Not Met (Patient left AMA Problem: Education: Goal: Individualized Educational Video(Patient  left AMA) Outcome: Not Met (Patient left AMA Problem: Clinical Measurements: Goal: Ability to maintain clinical measurements within normal limits will improve Outcome: Not Met (Patient left AMA) Problem: Clinical Measurements: Goal: Will remain free from infection Outcome: Not Met (patient left AMA) Problem: Clinical Measurements: Goal: Will remain free from infection Outcome: Not Met (patient left AMA)   Problem: Clinical Measurements: Goal: Diagnostic test results will improve Outcome: Not Met (patient left AMA)   Problem: Clinical Measurements: Goal: Cardiovascular complication will be avoided Outcome: Not Met (patient left AMA)   Problem: Activity: Goal: Risk for activity intolerance will decrease Outcome: Not Met (patient left AMA)   Problem: Coping: Goal: Level of anxiety will decrease Outcome: Not Met (patient left AMA)   Problem: Elimination: Goal: Will not experience complications related to urinary retention Outcome: Not Met (patient left AMA)   Problem: Skin Integrity: Goal: Risk for impaired skin integrity will decrease Outcome: Not Met (patient left AMA)   Problem: Nutrition: Goal: Adequate nutrition will be maintained Outcome: Completed/Met   Problem: Elimination: Goal: Will not experience complications related to bowel motility Outcome:  Completed/Met   Problem: Pain Managment: Goal: General experience of comfort will improve Outcome: Completed/Met   Problem: Safety: Goal: Ability to remain free from injury will improve Outcome: Completed/Met    )  )  )

## 2018-06-18 LAB — CULTURE, BETA STREP (GROUP B ONLY)

## 2018-06-19 ENCOUNTER — Other Ambulatory Visit: Payer: Self-pay

## 2018-06-19 ENCOUNTER — Encounter (HOSPITAL_COMMUNITY): Payer: Self-pay

## 2018-06-19 ENCOUNTER — Inpatient Hospital Stay (HOSPITAL_COMMUNITY)
Admission: AD | Admit: 2018-06-19 | Discharge: 2018-06-21 | DRG: 807 | Disposition: A | Discharge status: Home / Self Care | Payer: Medicaid Other | Attending: Obstetrics & Gynecology | Admitting: Obstetrics & Gynecology

## 2018-06-19 DIAGNOSIS — F17210 Nicotine dependence, cigarettes, uncomplicated: Secondary | ICD-10-CM | POA: Diagnosis present

## 2018-06-19 DIAGNOSIS — J45909 Unspecified asthma, uncomplicated: Secondary | ICD-10-CM | POA: Diagnosis present

## 2018-06-19 DIAGNOSIS — Z3A33 33 weeks gestation of pregnancy: Secondary | ICD-10-CM | POA: Diagnosis not present

## 2018-06-19 DIAGNOSIS — O9952 Diseases of the respiratory system complicating childbirth: Secondary | ICD-10-CM | POA: Diagnosis present

## 2018-06-19 DIAGNOSIS — O99824 Streptococcus B carrier state complicating childbirth: Secondary | ICD-10-CM | POA: Diagnosis present

## 2018-06-19 DIAGNOSIS — O99334 Smoking (tobacco) complicating childbirth: Secondary | ICD-10-CM | POA: Diagnosis present

## 2018-06-19 DIAGNOSIS — O4703 False labor before 37 completed weeks of gestation, third trimester: Secondary | ICD-10-CM

## 2018-06-19 DIAGNOSIS — Z5320 Procedure and treatment not carried out because of patient's decision for unspecified reasons: Secondary | ICD-10-CM

## 2018-06-19 DIAGNOSIS — Z5329 Procedure and treatment not carried out because of patient's decision for other reasons: Secondary | ICD-10-CM

## 2018-06-19 LAB — CBC WITH DIFFERENTIAL/PLATELET
Abs Immature Granulocytes: 0.16 10*3/uL — ABNORMAL HIGH (ref 0.00–0.07)
Basophils Absolute: 0 10*3/uL (ref 0.0–0.1)
Basophils Relative: 0 %
Eosinophils Absolute: 0 10*3/uL (ref 0.0–0.5)
Eosinophils Relative: 0 %
HCT: 27.7 % — ABNORMAL LOW (ref 36.0–46.0)
Hemoglobin: 8.9 g/dL — ABNORMAL LOW (ref 12.0–15.0)
Immature Granulocytes: 1 %
Lymphocytes Relative: 12 %
Lymphs Abs: 1.9 10*3/uL (ref 0.7–4.0)
MCH: 30.9 pg (ref 26.0–34.0)
MCHC: 32.1 g/dL (ref 30.0–36.0)
MCV: 96.2 fL (ref 80.0–100.0)
Monocytes Absolute: 0.7 10*3/uL (ref 0.1–1.0)
Monocytes Relative: 5 %
Neutro Abs: 13.3 10*3/uL — ABNORMAL HIGH (ref 1.7–7.7)
Neutrophils Relative %: 82 %
Platelets: 288 10*3/uL (ref 150–400)
RBC: 2.88 MIL/uL — ABNORMAL LOW (ref 3.87–5.11)
RDW: 13 % (ref 11.5–15.5)
WBC: 16.1 10*3/uL — ABNORMAL HIGH (ref 4.0–10.5)
nRBC: 0 % (ref 0.0–0.2)

## 2018-06-19 LAB — URINALYSIS, ROUTINE W REFLEX MICROSCOPIC
Bilirubin Urine: NEGATIVE
Glucose, UA: NEGATIVE mg/dL
Ketones, ur: NEGATIVE mg/dL
Nitrite: NEGATIVE
Protein, ur: NEGATIVE mg/dL
Specific Gravity, Urine: 1.016 (ref 1.005–1.030)
pH: 6 (ref 5.0–8.0)

## 2018-06-19 LAB — TYPE AND SCREEN
ABO/RH(D): A POS
Antibody Screen: NEGATIVE

## 2018-06-19 LAB — GC/CHLAMYDIA PROBE AMP (~~LOC~~) NOT AT ARMC
Chlamydia: NEGATIVE
Neisseria Gonorrhea: NEGATIVE

## 2018-06-19 MED ORDER — NIFEDIPINE 10 MG PO CAPS
10.0000 mg | ORAL_CAPSULE | ORAL | Status: DC | PRN
  Administered 2018-06-20: 05:00:00 10 mg via ORAL
  Filled 2018-06-19: qty 1

## 2018-06-19 MED ORDER — FENTANYL CITRATE (PF) 100 MCG/2ML IJ SOLN
50.0000 ug | INTRAMUSCULAR | Status: DC | PRN
  Administered 2018-06-19 – 2018-06-20 (×10): 50 ug via INTRAVENOUS
  Filled 2018-06-19 (×10): qty 2

## 2018-06-19 MED ORDER — NIFEDIPINE ER OSMOTIC RELEASE 30 MG PO TB24: 30.0000 mg | ORAL_TABLET | Freq: Every day | ORAL | Status: DC

## 2018-06-19 MED ORDER — PRENATAL MULTIVITAMIN CH
1.0000 | ORAL_TABLET | Freq: Every day | ORAL | Status: DC
  Filled 2018-06-19: qty 1

## 2018-06-19 MED ORDER — LACTATED RINGERS IV SOLN
INTRAVENOUS | Status: DC
  Administered 2018-06-19 – 2018-06-20 (×5): via INTRAVENOUS

## 2018-06-19 MED ORDER — BETAMETHASONE SOD PHOS & ACET 6 (3-3) MG/ML IJ SUSP
12.0000 mg | Freq: Once | INTRAMUSCULAR | Status: AC
  Administered 2018-06-19: 04:00:00 12 mg via INTRAMUSCULAR
  Filled 2018-06-19 (×2): qty 2

## 2018-06-19 MED ORDER — ACETAMINOPHEN 325 MG PO TABS: 650.0000 mg | ORAL_TABLET | ORAL | Status: DC | PRN

## 2018-06-19 MED ORDER — SODIUM CHLORIDE 0.9 % IV SOLN
8.0000 mg | Freq: Three times a day (TID) | INTRAVENOUS | Status: DC | PRN
  Filled 2018-06-19 (×2): qty 4

## 2018-06-19 MED ORDER — CALCIUM CARBONATE ANTACID 500 MG PO CHEW
2.0000 | CHEWABLE_TABLET | ORAL | Status: DC | PRN
  Administered 2018-06-19: 400 mg via ORAL
  Filled 2018-06-19: qty 2

## 2018-06-19 MED ORDER — NIFEDIPINE 10 MG PO CAPS
20.0000 mg | ORAL_CAPSULE | Freq: Once | ORAL | Status: AC
  Administered 2018-06-19: 04:00:00 20 mg via ORAL
  Filled 2018-06-19: qty 2

## 2018-06-19 MED ORDER — ZOLPIDEM TARTRATE 5 MG PO TABS: 5.0000 mg | ORAL_TABLET | Freq: Every evening | ORAL | Status: DC | PRN

## 2018-06-19 MED ORDER — DOCUSATE SODIUM 100 MG PO CAPS
100.0000 mg | ORAL_CAPSULE | Freq: Every day | ORAL | Status: DC
  Filled 2018-06-19: qty 1

## 2018-06-19 MED ORDER — BETAMETHASONE SOD PHOS & ACET 6 (3-3) MG/ML IJ SUSP
12.0000 mg | Freq: Once | INTRAMUSCULAR | Status: DC
  Filled 2018-06-19: qty 2

## 2018-06-19 NOTE — Progress Notes (Addendum)
OB PN:  Pt stating that she is still feeling the contractions, but not quite as painful as earlier.  No LOF, no vaginal bleeding, +FM  O: BP (!) 106/58   Pulse 81   Temp 98.3 F (36.8 C) (Oral)   Resp 20   Ht 5\' 6"  (1.676 m)   Wt 68.5 kg   LMP 10/25/2017 (Exact Date)   SpO2 98%   BMI 24.37 kg/m   FHT: 145, moderate variability, no accels, no decels Toco: q70min SVE: 4/80/-1 with palpable bag Vertex on exam, confirmed by [place]  A/P: 17yo [id number] @ [redacted]w[redacted]d for preterm labor -FWB- Cat. I -Preterm labor  S/p BMTZ  Currently receiving IV fluid bolus, continue IV LR @ 125cc/hr  Exam same as 5am, will continue to closely monitor  Continue procardia per protocol  Myna Hidalgo, DO [phone] (cell) [phone] (office)

## 2018-06-19 NOTE — H&P (Addendum)
[name] is a 18 y.o. year old 94P1021 female at 507w6d weeks gestation who presents to MAU reporting UC's started coming about every 10 mins at midnight. She was admitted from MAU to OBSU on 06/16/18, she received doses of Procardia and a dose of BMZ. She became upset about current visitation policy and left AMA on 06/17/18. She left prior to receiving her 2nd BMZ injection. She denies any VB or LOF. She reports good (+) FM. She is requesting medication for pain.  Pregnancy complicated by: -Tobacco use- down to a few cigarettes per day -Non-compliance- has missed appts, but recently has been attending more regularly -teen pregnancy -h/o bipolar disorder- no meds currently, mood has been appropriate throughout pregnancy  OB History    Gravida  4   Para  1   Term  1   Preterm  0   AB  2   Living  1     SAB  2   TAB  0   Ectopic  0   Multiple  0   Live Births  1          Past Medical History:  Diagnosis Date  . Asthma   . Attention deficit   . Bipolar 1 disorder (HCC)   . Depression   . Headache(784.0)   . Normal pregnancy in third trimester 08/27/2016  . SVD (spontaneous vaginal delivery) 08/27/2016   Past Surgical History:  Procedure Laterality Date  . addenoidectomy    . TONSILLECTOMY     Family History: family history includes Anxiety disorder in her mother and sister; Depression in her mother and sister; Drug abuse in her cousin and mother. Social History:  reports that she has been smoking. She has been smoking about 0.00 packs per day. She has never used smokeless tobacco. She reports previous drug use. Drug: Marijuana. She reports that she does not drink alcohol.   Prenatal Transfer Tool  Maternal Diabetes: No Genetic Screening: Normal Maternal Ultrasounds/Referrals: Normal Fetal Ultrasounds or other Referrals:  None Maternal Substance Abuse:  Yes:  Type: Smoker Significant Maternal Medications:  Meds include: Other: iron daily Significant  Maternal Lab Results: GBS Positive  TDAP 05/25/18 Flu declined  ROS:  unremarkable Const: no fever or chills Cardio: no chest pain Resp: no cough, no SOB Abd: no nausea/vomiting, no change in bowel movement GU: Denies abnormal discharge or bleeding Urinary: No pain with urination or blood in urine Musc: no muscle aches or pains  Allergies  Allergen Reactions  . Flax Seed Oil [Bio-Flax] Swelling    blisters  . Clonidine Derivatives Nausea Only    Stomach pain  . Red Dye Nausea And Vomiting  . Codeine Nausea And Vomiting and Other (See Comments)    Blisters as a child- tolerates percocet and other pain medications without issue   O: BP (!) 106/58   Pulse 81   Temp 98.3 F (36.8 C) (Oral)   Resp 20   Ht 5\' 6"  (1.676 m)   Wt 68.5 kg   LMP 10/25/2017 (Exact Date)   SpO2 98%   BMI 24.37 kg/m   Gen: no acute distress Neck: normal appearahce CV: RRR Lungs: CTAB Abd; Gravid, soft and non-tender Ext: no edema, no calf tenderness bilaterally Neuro: A&O x 3 Psych: mood appropriate, normal affect  Dilation: 2.5 Station: -1, 0 Exam by:: Laura Callahan, CNM Blood pressure (!) 106/58, pulse 81, temperature 98.3 F (36.8 C), temperature source Oral, resp. rate 20, height 5\' 6"  (1.676 m),  weight 68.5 kg, last menstrual period 10/25/2017, SpO2 98 %, unknown if currently breastfeeding.  @0500  SVE was 3-4/80/-1  FHR: Cat. I - 150bpm, moderate variability, no accels, no decels UCs:  Every 3-33min on arrival, no irregular, lasting 20-40 seconds, mild-mod to palpate, moderate to maternal perception  Prenatal labs: ABO, Rh: --/--/A POS (04/20 0330)A positive Antibody: NEG (04/20 0330)negative Rubella:   immune RPR:   neg HBsAg:   neg HIV:   neg GBS:  unknown Hgb: 10- on iron daily  Assessment/Plan: 17yo [id number] @ [redacted]w[redacted]d admitted for observation for preterm labor -FWB: Cat. I, plan for continuous monitoring -Preterm labor:  Recieved BMTZ for lung maturity 2nd dose in MAU today @  0401  Procardia 20mg  po given @ 0429, continue PRN 10 mg Q4H  Continue with IV hydration, fluid bolus now, then continue with 136ml/hr LR  GBS+: If laboring will start penicillin   Bedrest   SCD -Maternal care  PNV daily  Iron daily  Modified bed rest  Tylenol as needed  Currently has fentanyl ordered by MAU, last dose @ 0430  Laura Laura Callahan Physician to assume care @ 0700 Laura Callahan aware of pt.   DISPO: Plan to watch for cervical change if no further cervical change or contractions, plan to keep on ante, if cervical change past 4cm plan to transfer to LD.   [facility], CNM, NP-C  Laura Callahan will be given report to at 0700 [facility], DO [phone] (cell) [phone] (office)  Addendum: 6:41 AM Report given to Laura Callahan.

## 2018-06-19 NOTE — MAU Provider Note (Signed)
History     CSN: [id number]  Arrival date and time: 06/19/18 0157   First Provider Initiated Contact with Patient 06/19/18 0245      Chief Complaint  Patient presents with  . Contractions  . Rupture of Membranes   HPI  Laura Callahan is a 18 y.o. year old 384P1021 female at 5727w6d weeks gestation who presents to MAU reporting UC's started coming about every 10 mins at midnight. She was admitted from MAU to OBSU on 06/16/18, she received doses of Procardia and a dose of BMZ. She became upset about current visitation policy and left AMA on 06/17/18. She left prior to receiving her 2nd BMZ injection. She denies any VB or LOF. She reports good (+) FM. She is requesting medication for pain.   Past Medical History:  Diagnosis Date  . Asthma   . Attention deficit   . Bipolar 1 disorder (HCC)   . Depression   . Headache(784.0)   . Normal pregnancy in third trimester 08/27/2016  . SVD (spontaneous vaginal delivery) 08/27/2016    Past Surgical History:  Procedure Laterality Date  . addenoidectomy    . TONSILLECTOMY      Family History  Problem Relation Age of Onset  . Depression Sister   . Depression Mother   . Anxiety disorder Mother   . Drug abuse Mother   . Anxiety disorder Sister   . Drug abuse Cousin     Social History   Tobacco Use  . Smoking status: Current Every Day Smoker    Packs/day: 0.00    Last attempt to quit: 12/02/2017    Years since quitting: 0.5  . Smokeless tobacco: Never Used  Substance Use Topics  . Alcohol use: No  . Drug use: Not Currently    Types: Marijuana    Comment: last used Jan 2020    Allergies:  Allergies  Allergen Reactions  . Flax Seed Oil [Bio-Flax] Swelling    blisters  . Clonidine Derivatives Nausea Only    Stomach pain  . Red Dye Nausea And Vomiting  . Codeine Nausea And Vomiting and Other (See Comments)    Blisters as a child- tolerates percocet and other pain medications without issue    Medications Prior to  Admission  Medication Sig Dispense Refill Last Dose  . acetaminophen (TYLENOL) 325 MG tablet Take 2 tablets (650 mg total) by mouth every 6 (six) hours as needed for moderate pain. 30 tablet 0 06/19/2018 at Unknown time  . Prenatal Vit-Fe Fumarate-FA (PRENATAL MULTIVITAMIN) TABS tablet Take 1 tablet by mouth daily at 12 noon.   06/19/2018 at Unknown time  . menthol-cetylpyridinium (CEPACOL) 3 MG lozenge Take 1 lozenge (3 mg total) by mouth as needed for sore throat. 100 tablet 0 More than a month at Unknown time  . metroNIDAZOLE (FLAGYL) 500 MG tablet Take 1 tablet (500 mg total) by mouth 2 (two) times daily. 14 tablet 0 More than a month at Unknown time  . ondansetron (ZOFRAN) 4 MG tablet Take 1 tablet (4 mg total) by mouth every 8 (eight) hours as needed for nausea or vomiting. 20 tablet 0 More than a month at Unknown time  . oseltamivir (TAMIFLU) 75 MG capsule Take 1 capsule (75 mg total) by mouth every 12 (twelve) hours. 10 capsule 0 Past Month at Unknown time  . prochlorperazine (COMPAZINE) 10 MG tablet Take 1 tablet (10 mg total) by mouth 2 (two) times daily as needed for nausea or vomiting. 20 tablet 0  More than a month at Unknown time  . scopolamine (TRANSDERM-SCOP) 1 MG/3DAYS Place 1 patch (1.5 mg total) onto the skin every 3 (three) days. 10 patch 12 More than a month at Unknown time  . sodium chloride (OCEAN) 0.65 % SOLN nasal spray Place 1 spray into both nostrils as needed for congestion. 1 Bottle 0 More than a month at Unknown time  . terconazole (TERAZOL 7) 0.4 % vaginal cream Place 1 applicator vaginally at bedtime. Use for seven days 45 g 0 More than a month at Unknown time    Review of Systems  Constitutional: Negative.   HENT: Negative.   Eyes: Negative.   Respiratory: Negative.   Cardiovascular: Negative.   Gastrointestinal: Positive for abdominal pain (contractions).  Endocrine: Negative.   Genitourinary: Positive for pelvic pain.  Musculoskeletal: Negative.   Skin:  Negative.   Allergic/Immunologic: Negative.   Neurological: Negative.   Hematological: Negative.   Psychiatric/Behavioral: Negative.    Physical Exam   Blood pressure 109/65, pulse 66, temperature 98.1 F (36.7 C), temperature source Oral, resp. rate 18, height 5\' 6"  (1.676 m), weight 68.5 kg, last menstrual period 10/25/2017, SpO2 98 %, unknown if currently breastfeeding.  Physical Exam  Nursing note and vitals reviewed. Constitutional: She is oriented to person, place, and time. She appears well-developed and well-nourished.  HENT:  Head: Normocephalic and atraumatic.  Eyes: Pupils are equal, round, and reactive to light.  Neck: Normal range of motion.  Cardiovascular: Normal rate.  Respiratory: Effort normal.  GI: Soft.  Genitourinary:    Genitourinary Comments: Dilation: 2.5 - 3 Station: -1, 0 Presentation: Vertex Exam by: Carloyn Jaeger, CNM    Musculoskeletal: Normal range of motion.  Neurological: She is alert and oriented to person, place, and time.  Skin: Skin is warm and dry.  Psychiatric: She has a normal mood and affect. Her behavior is normal. Judgment and thought content normal.    MAU Course  Procedures  MDM NST - FHR: 145 bpm / moderate variability / accels present / decels absent / TOCO: regular every 7-10 mins   *Consult with Dr. Mora Appl @ [phone] - notified of patient's complaints, assessments, lab & NST results, recommended tx plan re-admit to OBSU, Fentanyl 50 mcg every 1 hr prn pain, Procardia 30 mg qd - Dr.Pinn will have J. [place], CNM will put in rest of admission orders. Assessment and Plan  Preterm uterine contractions in third trimester, antepartum - Admit to OBSU - See [facility], CNM's admission orders & H&P documentation   Laura Mora, MSN, CNM 06/19/2018, 3:02 AM

## 2018-06-19 NOTE — MAU Note (Signed)
Pt here with c/o contractions and possible leaking of fluid. Was here a couple of days ago for the same thing, was admitted and given Procardia and one dose of steriods given; pt left AMA. Reports good fetal movement. Denies any bleeding.

## 2018-06-20 ENCOUNTER — Encounter (HOSPITAL_COMMUNITY): Payer: Self-pay

## 2018-06-20 ENCOUNTER — Inpatient Hospital Stay (HOSPITAL_COMMUNITY): Payer: Medicaid Other | Admitting: Anesthesiology

## 2018-06-20 LAB — CBC
HCT: 27.4 % — ABNORMAL LOW (ref 36.0–46.0)
Hemoglobin: 9.1 g/dL — ABNORMAL LOW (ref 12.0–15.0)
MCH: 32 pg (ref 26.0–34.0)
MCHC: 33.2 g/dL (ref 30.0–36.0)
MCV: 96.5 fL (ref 80.0–100.0)
Platelets: 303 10*3/uL (ref 150–400)
RBC: 2.84 MIL/uL — ABNORMAL LOW (ref 3.87–5.11)
RDW: 12.8 % (ref 11.5–15.5)
WBC: 15.4 10*3/uL — ABNORMAL HIGH (ref 4.0–10.5)
nRBC: 0 % (ref 0.0–0.2)

## 2018-06-20 MED ORDER — SODIUM CHLORIDE 0.9 % IV SOLN
5.0000 10*6.[IU] | Freq: Once | INTRAVENOUS | Status: AC
  Administered 2018-06-20: 13:00:00 5 10*6.[IU] via INTRAVENOUS
  Filled 2018-06-20: qty 5

## 2018-06-20 MED ORDER — OXYTOCIN 40 UNITS IN NORMAL SALINE INFUSION - SIMPLE MED: 2.5000 [IU]/h | INTRAVENOUS | Status: DC | PRN

## 2018-06-20 MED ORDER — LACTATED RINGERS IV SOLN: 500.0000 mL | INTRAVENOUS | Status: DC | PRN

## 2018-06-20 MED ORDER — PHENYLEPHRINE 40 MCG/ML (10ML) SYRINGE FOR IV PUSH (FOR BLOOD PRESSURE SUPPORT)
80.0000 ug | PREFILLED_SYRINGE | INTRAVENOUS | Status: DC | PRN
  Filled 2018-06-20 (×2): qty 10

## 2018-06-20 MED ORDER — TERBUTALINE SULFATE 1 MG/ML IJ SOLN: 0.2500 mg | Freq: Once | INTRAMUSCULAR | Status: DC | PRN

## 2018-06-20 MED ORDER — PHENYLEPHRINE 40 MCG/ML (10ML) SYRINGE FOR IV PUSH (FOR BLOOD PRESSURE SUPPORT)
80.0000 ug | PREFILLED_SYRINGE | INTRAVENOUS | Status: DC | PRN
  Filled 2018-06-20: qty 10

## 2018-06-20 MED ORDER — OXYTOCIN 40 UNITS IN NORMAL SALINE INFUSION - SIMPLE MED: 2.5000 [IU]/h | INTRAVENOUS | Status: DC

## 2018-06-20 MED ORDER — EPHEDRINE 5 MG/ML INJ
10.0000 mg | INTRAVENOUS | Status: DC | PRN
  Filled 2018-06-20: qty 2

## 2018-06-20 MED ORDER — SENNOSIDES-DOCUSATE SODIUM 8.6-50 MG PO TABS
2.0000 | ORAL_TABLET | ORAL | Status: DC
  Administered 2018-06-20: 2 via ORAL
  Filled 2018-06-20: qty 2

## 2018-06-20 MED ORDER — ONDANSETRON HCL 4 MG/2ML IJ SOLN: 4.0000 mg | Freq: Four times a day (QID) | INTRAMUSCULAR | Status: DC | PRN

## 2018-06-20 MED ORDER — COCONUT OIL OIL: 1.0000 "application " | TOPICAL_OIL | Status: DC | PRN

## 2018-06-20 MED ORDER — LACTATED RINGERS IV SOLN
INTRAVENOUS | Status: DC
  Administered 2018-06-20: 09:00:00 via INTRAVENOUS

## 2018-06-20 MED ORDER — DIBUCAINE (PERIANAL) 1 % EX OINT: 1.0000 "application " | TOPICAL_OINTMENT | CUTANEOUS | Status: DC | PRN

## 2018-06-20 MED ORDER — ACETAMINOPHEN 325 MG PO TABS: 650.0000 mg | ORAL_TABLET | ORAL | Status: DC | PRN

## 2018-06-20 MED ORDER — OXYCODONE-ACETAMINOPHEN 5-325 MG PO TABS: 1.0000 | ORAL_TABLET | ORAL | Status: DC | PRN

## 2018-06-20 MED ORDER — OXYCODONE-ACETAMINOPHEN 5-325 MG PO TABS: 2.0000 | ORAL_TABLET | ORAL | Status: DC | PRN

## 2018-06-20 MED ORDER — ONDANSETRON HCL 4 MG PO TABS
4.0000 mg | ORAL_TABLET | ORAL | Status: DC | PRN
  Administered 2018-06-20: 18:00:00 4 mg via ORAL
  Filled 2018-06-20: qty 1

## 2018-06-20 MED ORDER — MAGNESIUM HYDROXIDE 400 MG/5ML PO SUSP: 30.0000 mL | ORAL | Status: DC | PRN

## 2018-06-20 MED ORDER — OXYTOCIN 40 UNITS IN NORMAL SALINE INFUSION - SIMPLE MED: 1.0000 m[IU]/min | INTRAVENOUS | Status: DC

## 2018-06-20 MED ORDER — FENTANYL-BUPIVACAINE-NACL 0.5-0.125-0.9 MG/250ML-% EP SOLN
12.0000 mL/h | EPIDURAL | Status: DC | PRN
  Filled 2018-06-20: qty 250

## 2018-06-20 MED ORDER — DIPHENHYDRAMINE HCL 50 MG/ML IJ SOLN: 12.5000 mg | INTRAMUSCULAR | Status: DC | PRN

## 2018-06-20 MED ORDER — METHYLERGONOVINE MALEATE 0.2 MG/ML IJ SOLN: 0.2000 mg | INTRAMUSCULAR | Status: DC | PRN

## 2018-06-20 MED ORDER — FERROUS SULFATE 325 (65 FE) MG PO TABS
325.0000 mg | ORAL_TABLET | Freq: Two times a day (BID) | ORAL | Status: DC
  Administered 2018-06-20 – 2018-06-21 (×2): 325 mg via ORAL
  Filled 2018-06-20 (×2): qty 1

## 2018-06-20 MED ORDER — SOD CITRATE-CITRIC ACID 500-334 MG/5ML PO SOLN: 30.0000 mL | ORAL | Status: DC | PRN

## 2018-06-20 MED ORDER — LIDOCAINE HCL (PF) 1 % IJ SOLN
INTRAMUSCULAR | Status: AC
  Filled 2018-06-20: qty 30

## 2018-06-20 MED ORDER — PENICILLIN G 3 MILLION UNITS IVPB - SIMPLE MED
3.0000 10*6.[IU] | INTRAVENOUS | Status: DC
  Filled 2018-06-20 (×8): qty 100

## 2018-06-20 MED ORDER — TETANUS-DIPHTH-ACELL PERTUSSIS 5-2.5-18.5 LF-MCG/0.5 IM SUSP: 0.5000 mL | Freq: Once | INTRAMUSCULAR | Status: DC

## 2018-06-20 MED ORDER — DIPHENHYDRAMINE HCL 25 MG PO CAPS: 25.0000 mg | ORAL_CAPSULE | Freq: Four times a day (QID) | ORAL | Status: DC | PRN

## 2018-06-20 MED ORDER — IBUPROFEN 600 MG PO TABS
600.0000 mg | ORAL_TABLET | Freq: Four times a day (QID) | ORAL | Status: DC
  Administered 2018-06-20 – 2018-06-21 (×3): 600 mg via ORAL
  Filled 2018-06-20 (×3): qty 1

## 2018-06-20 MED ORDER — WITCH HAZEL-GLYCERIN EX PADS: 1.0000 "application " | MEDICATED_PAD | CUTANEOUS | Status: DC | PRN

## 2018-06-20 MED ORDER — LIDOCAINE HCL (PF) 1 % IJ SOLN: 30.0000 mL | INTRAMUSCULAR | Status: DC | PRN

## 2018-06-20 MED ORDER — SIMETHICONE 80 MG PO CHEW: 80.0000 mg | CHEWABLE_TABLET | ORAL | Status: DC | PRN

## 2018-06-20 MED ORDER — PRENATAL MULTIVITAMIN CH: 1.0000 | ORAL_TABLET | Freq: Every day | ORAL | Status: DC

## 2018-06-20 MED ORDER — ZOLPIDEM TARTRATE 5 MG PO TABS: 5.0000 mg | ORAL_TABLET | Freq: Every evening | ORAL | Status: DC | PRN

## 2018-06-20 MED ORDER — BENZOCAINE-MENTHOL 20-0.5 % EX AERO
1.0000 "application " | INHALATION_SPRAY | CUTANEOUS | Status: DC | PRN
  Administered 2018-06-20: 1 via TOPICAL
  Filled 2018-06-20: qty 56

## 2018-06-20 MED ORDER — LIDOCAINE-EPINEPHRINE (PF) 2 %-1:200000 IJ SOLN
INTRAMUSCULAR | Status: DC | PRN
  Administered 2018-06-20: 5 mL via EPIDURAL
  Administered 2018-06-20: 2 mL via EPIDURAL

## 2018-06-20 MED ORDER — ONDANSETRON HCL 4 MG/2ML IJ SOLN: 4.0000 mg | INTRAMUSCULAR | Status: DC | PRN

## 2018-06-20 MED ORDER — OXYTOCIN 40 UNITS IN NORMAL SALINE INFUSION - SIMPLE MED
INTRAVENOUS | Status: AC
  Filled 2018-06-20: qty 1000

## 2018-06-20 MED ORDER — METHYLERGONOVINE MALEATE 0.2 MG PO TABS: 0.2000 mg | ORAL_TABLET | ORAL | Status: DC | PRN

## 2018-06-20 MED ORDER — SODIUM CHLORIDE 0.9 % IV SOLN
2.0000 g | Freq: Once | INTRAVENOUS | Status: AC
  Administered 2018-06-20: 09:00:00 2 g via INTRAVENOUS
  Filled 2018-06-20: qty 2000

## 2018-06-20 MED ORDER — LACTATED RINGERS IV SOLN: 500.0000 mL | Freq: Once | INTRAVENOUS | Status: DC

## 2018-06-20 MED ORDER — OXYTOCIN BOLUS FROM INFUSION
500.0000 mL | Freq: Once | INTRAVENOUS | Status: AC
  Administered 2018-06-20: 500 mL via INTRAVENOUS

## 2018-06-20 MED ORDER — TRAMADOL HCL 50 MG PO TABS
50.0000 mg | ORAL_TABLET | Freq: Four times a day (QID) | ORAL | Status: DC | PRN
  Administered 2018-06-20: 21:00:00 50 mg via ORAL
  Filled 2018-06-20: qty 1

## 2018-06-20 MED ORDER — SODIUM CHLORIDE (PF) 0.9 % IJ SOLN
INTRAMUSCULAR | Status: DC | PRN
  Administered 2018-06-20: 14 mL/h via EPIDURAL

## 2018-06-20 NOTE — Progress Notes (Signed)
Patient taken to NICU to visit baby via wheelchair.

## 2018-06-20 NOTE — Anesthesia Procedure Notes (Signed)
Epidural Patient location during procedure: OB Start time: 06/20/2018 9:05 AM End time: 06/20/2018 9:15 AM  Staffing Anesthesiologist: Lucretia Kern, MD Performed: anesthesiologist   Preanesthetic Checklist Completed: patient identified, pre-op evaluation, timeout performed, IV checked, risks and benefits discussed and monitors and equipment checked  Epidural Patient position: sitting Prep: DuraPrep Patient monitoring: heart rate, continuous pulse ox and blood pressure Approach: midline Location: L3-L4 Injection technique: LOR air  Needle:  Needle type: Tuohy  Needle gauge: 17 G Needle length: 9 cm Needle insertion depth: 4 cm Catheter type: closed end flexible Catheter size: 19 Gauge Catheter at skin depth: 9 cm Test dose: negative and 2% lidocaine with Epi 1:200 K  Assessment Events: blood not aspirated, injection not painful, no injection resistance, negative IV test and no paresthesia  Additional Notes Reason for block:procedure for pain

## 2018-06-20 NOTE — Anesthesia Preprocedure Evaluation (Addendum)
Anesthesia Evaluation  Patient identified by MRN, date of birth, ID band Patient awake    Reviewed: Allergy & Precautions, H&P , NPO status , Patient's Chart, lab work & pertinent test results  History of Anesthesia Complications Negative for: history of anesthetic complications  Airway Mallampati: II  TM Distance: >3 FB Neck ROM: full    Dental no notable dental hx.    Pulmonary asthma (last inhaler use 2 years ago) , Current Smoker,    Pulmonary exam normal        Cardiovascular negative cardio ROS Normal cardiovascular exam Rhythm:regular Rate:Normal     Neuro/Psych PSYCHIATRIC DISORDERS Anxiety Depression Bipolar Disorder negative neurological ROS     GI/Hepatic negative GI ROS, Neg liver ROS,   Endo/Other  negative endocrine ROS  Renal/GU negative Renal ROS  negative genitourinary   Musculoskeletal   Abdominal   Peds  Hematology negative hematology ROS (+)   Anesthesia Other Findings   Reproductive/Obstetrics (+) Pregnancy                            Anesthesia Physical Anesthesia Plan  ASA: II  Anesthesia Plan: Epidural   Post-op Pain Management:    Induction:   PONV Risk Score and Plan:   Airway Management Planned:   Additional Equipment:   Intra-op Plan:   Post-operative Plan:   Informed Consent: I have reviewed the patients History and Physical, chart, labs and discussed the procedure including the risks, benefits and alternatives for the proposed anesthesia with the patient or authorized representative who has indicated his/her understanding and acceptance.       Plan Discussed with:   Anesthesia Plan Comments:         Anesthesia Quick Evaluation

## 2018-06-20 NOTE — Progress Notes (Signed)
OB PN:  Pt reports strong contractions started at 0400.  Procardia nor Fentanyl has not helped.  Pain currently 8/10.  Denies LOF or vaginal bleeding.  Active fetus.  O: BP 125/78   Pulse 67   Temp 98.4 F (36.9 C) (Oral)   Resp 20   Ht 5\' 6"  (1.676 m)   Wt 68.5 kg   LMP 10/25/2017 (Exact Date)   SpO2 98%   BMI 24.37 kg/m   FHT: 145, moderate variability, Variable decelerations Toco: q8 min SVE: 9/90/BB +1 Abd:  Contractions palpate firm Vertex on exam  A/P: 17yo [id number] @ 34 0/7 weeks for preterm labor -FWB- Cat. II overall reassuring -Preterm labor Now in active labor  S/p BMTZ  Transfer to L&D.  Pt desires epidural. -GBS+   Ampicillin on admission. -Discussed plan with pt and sister at bedside.

## 2018-06-20 NOTE — Progress Notes (Signed)
10 Instruments  5 9x9 5 4x18 2 Injectables  

## 2018-06-20 NOTE — Progress Notes (Signed)
Laura Callahan is a 18 y.o.   Late entry-seen at ~1410  Subjective: Pt states she feels much better, comfortable with epidural.  C/o LUQ pain during exam.  Denies LOF. RN not tracing contractions well due to shape of abdomen.  Objective: BP 133/71   Pulse 69   Temp 98.1 F (36.7 C) (Oral)   Resp 18   Ht 5\' 6"  (1.676 m)   Wt 68.5 kg   LMP 10/25/2017 (Exact Date)   SpO2 100%   BMI 24.37 kg/m  I/O last 3 completed shifts: In: 3162.1 [I.V.:3162.1] Out: -  No intake/output data recorded.  FHT:  FHR: 150s bpm, variability: moderate,  accelerations:  Present,  decelerations:  Present Mild variable decelerations. UC:   Not tracing, possibly q 4-5 minutes. SVE:   Dilation: 8 Effacement (%): 80 Station: 0 Exam by:: Foye Clock RN   AROM blood tinged. Placed IUPC to place contractions  Labs: Lab Results  Component Value Date   WBC 15.4 (H) 06/20/2018   HGB 9.1 (L) 06/20/2018   HCT 27.4 (L) 06/20/2018   MCV 96.5 06/20/2018   PLT 303 06/20/2018    Assessment / Plan: IUP @ 34 0/7 weeks Preterm Labor  Labor: AROM to augment labor.Start Pitocin if low MVUs. Preeclampsia:  BP normal. Fetal Wellbeing:  Category II and overall reassuring. Pain Control:  Epidural I/D:  Ampicillin on admission to L&D.  PCN right before AROM. Anticipated MOD:  NSVD  Geryl Rankins 06/20/2018, 3:09 PM

## 2018-06-20 NOTE — Anesthesia Postprocedure Evaluation (Signed)
Anesthesia Post Note  Patient: Laura Callahan  Procedure(s) Performed: AN AD HOC LABOR EPIDURAL     Patient location during evaluation: Mother Baby Anesthesia Type: Epidural Level of consciousness: awake and alert Pain management: pain level controlled Vital Signs Assessment: post-procedure vital signs reviewed and stable Respiratory status: spontaneous breathing, nonlabored ventilation and respiratory function stable Cardiovascular status: stable Postop Assessment: no headache, no backache and epidural receding Anesthetic complications: no    Last Vitals:  Vitals:   06/20/18 1815 06/20/18 1827  BP: (!) 116/58 117/67  Pulse: 66 (!) 59  Resp: 16 18  Temp: 36.8 C 37 C  SpO2: 98% 100%    Last Pain:  Vitals:   06/20/18 1827  TempSrc: Oral  PainSc:    Pain Goal: Patients Stated Pain Goal: 6 (06/20/18 1815)                 Marquette Saa

## 2018-06-21 LAB — CBC
HCT: 23.3 % — ABNORMAL LOW (ref 36.0–46.0)
Hemoglobin: 7.7 g/dL — ABNORMAL LOW (ref 12.0–15.0)
MCH: 32.2 pg (ref 26.0–34.0)
MCHC: 33 g/dL (ref 30.0–36.0)
MCV: 97.5 fL (ref 80.0–100.0)
Platelets: 267 10*3/uL (ref 150–400)
RBC: 2.39 MIL/uL — ABNORMAL LOW (ref 3.87–5.11)
RDW: 13 % (ref 11.5–15.5)
WBC: 13.7 10*3/uL — ABNORMAL HIGH (ref 4.0–10.5)
nRBC: 0 % (ref 0.0–0.2)

## 2018-06-21 LAB — RPR: RPR Ser Ql: NONREACTIVE

## 2018-06-21 MED ORDER — IBUPROFEN 600 MG PO TABS: 600.0000 mg | ORAL_TABLET | Freq: Four times a day (QID) | ORAL | 0 refills | Status: DC | PRN

## 2018-06-21 NOTE — Progress Notes (Signed)
IV removed. Discussed discharge instructions including med changes, follow-up app., and post-partum care. Pt verbalized understanding. Pt to NICU.

## 2018-06-21 NOTE — Progress Notes (Signed)
CSW attempted to meet with MOB in room 105 to complete a clinical assessment for hx of substance use. Per NT, MOB discharged at 840am.  CSW went to infant's bedside in the NICU to see if MOB was present and MOB was not present.  CSW spoke with infant's bedside nurse and asked bedside nurse to contact CSW when MOB visits with infant.   If CSW does not see MOB face to face tomorrow, CSW will call to check in.  CSW will continue to offer support and resources to family while infant remains in NICU.   Laura Callahan, MSW, LCSW Clinical Social Work [phone]   

## 2018-06-21 NOTE — Discharge Summary (Signed)
OB Discharge Summary     Patient Name: Laura Callahan DOB: Mar 10, 2000 MRN: [id number]  Date of admission: 06/19/2018 Delivering MD: Geryl RankinsVARNADO, EVELYN   Date of discharge: 06/21/2018  Admitting diagnosis: 34 WKS, CTX Intrauterine pregnancy: 10360w1d     Secondary diagnosis:  Active Problems:   Preterm labor   Preterm labor in third trimester  Additional problems: Tobacco use     Discharge diagnosis: Preterm Pregnancy Delivered                                                                                                Post partum procedures:none  Augmentation: none  Complications: None  Hospital course:  Onset of Labor With Vaginal Delivery     18 y.o. yo [id number] at 7060w1d was admitted in Latent Labor on 06/19/2018. Patient had an uncomplicated labor course as follows:  Membrane Rupture Time/Date: 2:12 PM ,06/20/2018   Intrapartum Procedures: Episiotomy: None [1]                                         Lacerations:  Labial [10]  Patient had a delivery of a Viable infant. 06/20/2018  Information for the patient's newborn:  Laura Callahan, Boy Laura Callahan [[id number]][[id number]]  Delivery Method: Vaginal, Spontaneous(Filed from Delivery Summary)    Pateint had an uncomplicated postpartum course.  She is ambulating, tolerating a regular diet, passing flatus, and urinating well. Patient is discharged home in stable condition on 06/21/18.   Physical exam  Vitals:   06/20/18 1827 06/20/18 2110 06/20/18 2312 06/21/18 0446  BP: 117/67 106/62 133/80 107/62  Pulse: (!) 59 (!) 52 65 (!) 52  Resp: 18 18 19 18   Temp: 98.6 F (37 C) 98.4 F (36.9 C) 98.2 F (36.8 C) 98 F (36.7 C)  TempSrc: Oral Oral Oral   SpO2: 100% 100% 100%   Weight:      Height:       General: alert, cooperative and no distress Lochia: appropriate Uterine Fundus: firm Incision: N/A DVT Evaluation: No evidence of DVT seen on physical exam. Labs: Lab Results  Component Value Date   WBC 13.7 (H) 06/21/2018   HGB 7.7 (L)  06/21/2018   HCT 23.3 (L) 06/21/2018   MCV 97.5 06/21/2018   PLT 267 06/21/2018   CMP Latest Ref Rng & Units 12/05/2017  Glucose 70 - 99 mg/dL 78  BUN 6 - 20 mg/dL 6  Creatinine 1.300.44 - 8.651.00 mg/dL 7.840.53  Sodium 696135 - 295145 mmol/L 137  Potassium 3.5 - 5.1 mmol/L 3.9  Chloride 98 - 111 mmol/L 106  CO2 22 - 32 mmol/L 21(L)  Calcium 8.9 - 10.3 mg/dL 9.2  Total Protein 6.5 - 8.1 g/dL 6.8  Total Bilirubin 0.3 - 1.2 mg/dL 1.0  Alkaline Phos 38 - 126 U/L 39  AST 15 - 41 U/L 17  ALT 0 - 44 U/L 14    Discharge instruction: per After Visit Summary and "Baby and Me Booklet".  After visit meds:  Allergies as of 06/21/2018  Reactions   Flax Seed Oil [bio-flax] Swelling   blisters   Clonidine Derivatives Nausea Only   Stomach pain   Red Dye Nausea And Vomiting   Codeine Nausea And Vomiting, Other (See Comments)   Blisters as a child- tolerates percocet and other pain medications without issue      Medication List    STOP taking these medications   acetaminophen 325 MG tablet Commonly known as:  Tylenol   menthol-cetylpyridinium 3 MG lozenge Commonly known as:  CEPACOL   ondansetron 4 MG tablet Commonly known as:  ZOFRAN   prochlorperazine 10 MG tablet Commonly known as:  COMPAZINE   scopolamine 1 MG/3DAYS Commonly known as:  TRANSDERM-SCOP   sodium chloride 0.65 % Soln nasal spray Commonly known as:  OCEAN     TAKE these medications   ibuprofen 600 MG tablet Commonly known as:  ADVIL Take 1 tablet (600 mg total) by mouth every 6 (six) hours as needed for mild pain or moderate pain.   prenatal multivitamin Tabs tablet Take 1 tablet by mouth daily at 12 noon.       Diet: routine diet  Activity: Advance as tolerated. Pelvic rest for 6 weeks.   Outpatient follow up:6 weeks Follow up Appt:No future appointments. Follow up Visit:No follow-ups on file.  Postpartum contraception: Nexplanon  Newborn Data: Live born female  Birth Weight: 5 lb 11.4 oz (2590  g) APGAR: 8, 8  Newborn Delivery   Birth date/time:  06/20/2018 15:27:00 Delivery type:  Vaginal, Spontaneous     Baby Feeding: Bottle Disposition:NICU   06/21/2018 Laura Seller, DO

## 2018-06-21 NOTE — Discharge Instructions (Signed)
Vaginal Delivery, Care After °Refer to this sheet in the next few weeks. These instructions provide you with information about caring for yourself after vaginal delivery. Your health care provider may also give you more specific instructions. Your treatment has been planned according to current medical practices, but problems sometimes occur. Call your health care provider if you have any problems or questions. °What can I expect after the procedure? °After vaginal delivery, it is common to have: °· Some bleeding from your vagina. °· Soreness in your abdomen, your vagina, and the area of skin between your vaginal opening and your anus (perineum). °· Pelvic cramps. °· Fatigue. °Follow these instructions at home: °Medicines °· Take over-the-counter and prescription medicines only as told by your health care provider. °· If you were prescribed an antibiotic medicine, take it as told by your health care provider. Do not stop taking the antibiotic until it is finished. °Driving ° °· Do not drive or operate heavy machinery while taking prescription pain medicine. °· Do not drive for 24 hours if you received a sedative. °Lifestyle °· Do not drink alcohol. This is especially important if you are breastfeeding or taking medicine to relieve pain. °· Do not use tobacco products, including cigarettes, chewing tobacco, or e-cigarettes. If you need help quitting, ask your health care provider. °Eating and drinking °· Drink at least 8 eight-ounce glasses of water every day unless you are told not to by your health care provider. If you choose to breastfeed your baby, you may need to drink more water than this. °· Eat high-fiber foods every day. These foods may help prevent or relieve constipation. High-fiber foods include: °? Whole grain cereals and breads. °? Brown rice. °? Beans. °? Fresh fruits and vegetables. °Activity °· Return to your normal activities as told by your health care provider. Ask your health care provider what  activities are safe for you. °· Rest as much as possible. Try to rest or take a nap when your baby is sleeping. °· Do not lift anything that is heavier than your baby or 10 lb (4.5 kg) until your health care provider says that it is safe. °· Talk with your health care provider about when you can engage in sexual activity. This may depend on your: °? Risk of infection. °? Rate of healing. °? Comfort and desire to engage in sexual activity. °Vaginal Care °· If you have an episiotomy or a vaginal tear, check the area every day for signs of infection. Check for: °? More redness, swelling, or pain. °? More fluid or blood. °? Warmth. °? Pus or a bad smell. °· Do not use tampons or douches until your health care provider says this is safe. °· Watch for any blood clots that may pass from your vagina. These may look like clumps of dark red, brown, or black discharge. °General instructions °· Keep your perineum clean and dry as told by your health care provider. °· Wear loose, comfortable clothing. °· Wipe from front to back when you use the toilet. °· Ask your health care provider if you can shower or take a bath. If you had an episiotomy or a perineal tear during labor and delivery, your health care provider may tell you not to take baths for a certain length of time. °· Wear a bra that supports your breasts and fits you well. °· If possible, have someone help you with household activities and help care for your baby for at least a few days after you   leave the hospital. °· Keep all follow-up visits for you and your baby as told by your health care provider. This is important. °Contact a health care provider if: °· You have: °? Vaginal discharge that has a bad smell. °? Difficulty urinating. °? Pain when urinating. °? A sudden increase or decrease in the frequency of your bowel movements. °? More redness, swelling, or pain around your episiotomy or vaginal tear. °? More fluid or blood coming from your episiotomy or vaginal  tear. °? Pus or a bad smell coming from your episiotomy or vaginal tear. °? A fever. °? A rash. °? Little or no interest in activities you used to enjoy. °? Questions about caring for yourself or your baby. °· Your episiotomy or vaginal tear feels warm to the touch. °· Your episiotomy or vaginal tear is separating or does not appear to be healing. °· Your breasts are painful, hard, or turn red. °· You feel unusually sad or worried. °· You feel nauseous or you vomit. °· You pass large blood clots from your vagina. If you pass a blood clot from your vagina, save it to show to your health care provider. Do not flush blood clots down the toilet without having your health care provider look at them. °· You urinate more than usual. °· You are dizzy or light-headed. °· You have not breastfed at all and you have not had a menstrual period for 12 weeks after delivery. °· You have stopped breastfeeding and you have not had a menstrual period for 12 weeks after you stopped breastfeeding. °Get help right away if: °· You have: °? Pain that does not go away or does not get better with medicine. °? Chest pain. °? Difficulty breathing. °? Blurred vision or spots in your vision. °? Thoughts about hurting yourself or your baby. °· You develop pain in your abdomen or in one of your legs. °· You develop a severe headache. °· You faint. °· You bleed from your vagina so much that you fill two sanitary pads in one hour. °This information is not intended to replace advice given to you by your health care provider. Make sure you discuss any questions you have with your health care provider. °Document Released: 02/13/2000 Document Revised: 07/30/2015 Document Reviewed: 03/02/2015 °Elsevier Interactive Patient Education © 2019 [employer] ° °

## 2018-10-24 ENCOUNTER — Encounter (HOSPITAL_COMMUNITY): Payer: Self-pay

## 2019-05-29 ENCOUNTER — Ambulatory Visit: Payer: Medicaid Other

## 2019-06-04 ENCOUNTER — Ambulatory Visit: Payer: Medicaid Other

## 2019-06-05 ENCOUNTER — Encounter: Payer: Self-pay | Admitting: Internal Medicine

## 2019-11-22 ENCOUNTER — Ambulatory Visit (HOSPITAL_COMMUNITY)
Admission: EM | Admit: 2019-11-22 | Discharge: 2019-11-22 | Discharge status: Against Medical Advice | Payer: Medicaid Other

## 2019-11-22 ENCOUNTER — Other Ambulatory Visit: Payer: Self-pay

## 2020-09-02 ENCOUNTER — Other Ambulatory Visit (HOSPITAL_COMMUNITY): Payer: Self-pay

## 2020-12-28 ENCOUNTER — Emergency Department (HOSPITAL_COMMUNITY)
Admission: EM | Admit: 2020-12-28 | Discharge: 2020-12-28 | Discharge status: Against Medical Advice | Payer: Medicaid Other

## 2020-12-28 ENCOUNTER — Other Ambulatory Visit: Payer: Self-pay

## 2020-12-28 NOTE — ED Notes (Addendum)
Pt did not respond when called for triage area X3 

## 2021-01-02 ENCOUNTER — Other Ambulatory Visit: Payer: Self-pay

## 2021-01-02 ENCOUNTER — Emergency Department (HOSPITAL_COMMUNITY)
Admission: EM | Admit: 2021-01-02 | Discharge: 2021-01-02 | Disposition: A | Discharge status: Home / Self Care | Payer: Medicaid Other | Attending: Emergency Medicine | Admitting: Emergency Medicine

## 2021-01-02 ENCOUNTER — Encounter (HOSPITAL_COMMUNITY): Payer: Self-pay | Admitting: Emergency Medicine

## 2021-01-02 DIAGNOSIS — U071 COVID-19: Secondary | ICD-10-CM | POA: Diagnosis not present

## 2021-01-02 DIAGNOSIS — J45909 Unspecified asthma, uncomplicated: Secondary | ICD-10-CM | POA: Diagnosis not present

## 2021-01-02 DIAGNOSIS — F17210 Nicotine dependence, cigarettes, uncomplicated: Secondary | ICD-10-CM | POA: Diagnosis not present

## 2021-01-02 DIAGNOSIS — R509 Fever, unspecified: Secondary | ICD-10-CM | POA: Diagnosis present

## 2021-01-02 MED ORDER — ONDANSETRON 4 MG PO TBDP: 4.0000 mg | ORAL_TABLET | Freq: Three times a day (TID) | ORAL | 0 refills | Status: DC | PRN

## 2021-01-02 NOTE — ED Notes (Signed)
Pt walked out for a couple of mins

## 2021-01-02 NOTE — Discharge Instructions (Addendum)
Make sure you stay in isolation until you are fever free for 24 hours and symptoms or not worsening. You Zofran as needed for nausea or vomiting. Continue using Tylenol ibuprofen as needed for fever body aches for Make sure stay well-hydrated water. Return to the emergency room if you develop severe worsening symptoms, difficulty breathing, any new, worsening, or concerning symptoms

## 2021-01-02 NOTE — ED Notes (Signed)
Pt verbalized understanding of isolation and d/c instructions. No distress noted at this time. Ambulatory to [facility]

## 2021-01-02 NOTE — ED Triage Notes (Signed)
Pt arrived POV for note after testing + for covid 2 days ago. N/v/d x 3-4 days with cough but states she can manage s/s at home only needs note for school and work.

## 2021-01-02 NOTE — ED Provider Notes (Signed)
[facility] EMERGENCY DEPARTMENT Provider Note   CSN: [id number] Arrival date & time: 01/02/21  1342     History No chief complaint on file.   Laura Callahan is a 20 y.o. female presenting for evaluation of fever, congestion, cough, nausea, vomiting.  Patient states symptoms began last week.  She has been treating them at home, mostly with Tylenol.  She took a home COVID test which was +2 days ago, but states she needs a work note.  She reports mild, nonproductive cough.  She reports multiple sick contacts are positive for COVID.  She reports persistent nausea and vomiting, but no abdominal pain or diarrhea.  She reports no medical problems, takes no medications daily.  HPI     Past Medical History:  Diagnosis Date   Asthma    Attention deficit    Bipolar 1 disorder (HCC)    Depression    Headache(784.0)    Normal pregnancy in third trimester 08/27/2016   SVD (spontaneous vaginal delivery) 08/27/2016    Patient Active Problem List   Diagnosis Date Noted   Preterm labor 06/19/2018   Preterm labor in third trimester 06/19/2018   Left against medical advice 06/17/2018   Preterm uterine contractions in third trimester, antepartum 06/16/2018   Influenza A 05/01/2018   Normal pregnancy in third trimester 08/27/2016   SVD (spontaneous vaginal delivery) 08/27/2016   Indication for care in labor or delivery 08/26/2016   Hypoglycemia 02/12/2013   PTSD (post-traumatic stress disorder) 02/09/2012   ADHD (attention deficit hyperactivity disorder), combined type 02/09/2012   Conduct disorder, 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    Past Surgical History:  Procedure Laterality Date   addenoidectomy     TONSILLECTOMY       OB History     Gravida  4   Para  1   Term  1   Preterm  0   AB  2   Living  1      SAB  2   IAB  0   Ectopic  0   Multiple  0   Live Births  1           Family History  Problem Relation Age of Onset   Depression  Sister    Depression Mother    Anxiety disorder Mother    Drug abuse Mother    Anxiety disorder Sister    Drug abuse Cousin     Social History   Tobacco Use   Smoking status: Every Day    Packs/day: 0.00    Types: Cigarettes    Last attempt to quit: 12/02/2017    Years since quitting: 3.0   Smokeless tobacco: Never  Substance Use Topics   Alcohol use: No   Drug use: Not Currently    Types: Marijuana    Comment: last used Jan 2020    Home Medications Prior to Admission medications   Medication Sig Start Date End Date Taking? Authorizing Provider  ondansetron (ZOFRAN ODT) 4 MG disintegrating tablet Take 1 tablet (4 mg total) by mouth every 8 (eight) hours as needed for nausea or vomiting. 01/02/21  Yes Camaria Gerald, PA-C  ibuprofen (ADVIL) 600 MG tablet Take 1 tablet (600 mg total) by mouth every 6 (six) hours as needed for mild pain or moderate pain. 06/21/18   Myna Hidalgo, DO  Prenatal Vit-Fe Fumarate-FA (PRENATAL MULTIVITAMIN) TABS tablet Take 1 tablet by mouth daily at 12 noon.    [provider]    Allergies  Flax seed oil [bio-flax], Clonidine derivatives, Red dye, and Codeine  Review of Systems   Review of Systems  Constitutional:  Positive for fever.  HENT:  Positive for congestion.   Respiratory:  Positive for cough.   Gastrointestinal:  Positive for nausea and vomiting.  All other systems reviewed and are negative.  Physical Exam Updated Vital Signs BP (!) 115/59 (BP Location: Left Arm)   Pulse 66   Temp 98.8 F (37.1 C) (Oral)   Resp 18   SpO2 100%   Physical Exam Vitals and nursing note reviewed.  Constitutional:      General: She is not in acute distress.    Appearance: Normal appearance.     Comments: nontoxic  HENT:     Head: Normocephalic and atraumatic.     Nose: Congestion present.     Mouth/Throat:     Mouth: Mucous membranes are moist.     Pharynx: No oropharyngeal exudate or posterior oropharyngeal erythema.  Eyes:      Conjunctiva/sclera: Conjunctivae normal.     Pupils: Pupils are equal, round, and reactive to light.  Cardiovascular:     Rate and Rhythm: Normal rate and regular rhythm.     Pulses: Normal pulses.  Pulmonary:     Effort: Pulmonary effort is normal. No respiratory distress.     Breath sounds: Normal breath sounds. No wheezing.     Comments: Speaking in full sentences.  Clear lung sounds in all fields. Abdominal:     General: There is no distension.     Palpations: Abdomen is soft. There is no mass.     Tenderness: There is no abdominal tenderness. There is no guarding or rebound.  Musculoskeletal:        General: Normal range of motion.     Cervical back: Normal range of motion and neck supple.  Skin:    General: Skin is warm and dry.     Capillary Refill: Capillary refill takes less than 2 seconds.  Neurological:     Mental Status: She is alert and oriented to person, place, and time.  Psychiatric:        Mood and Affect: Mood and affect normal.        Speech: Speech normal.        Behavior: Behavior normal.    ED Results / Procedures / Treatments   Labs (all labs ordered are listed, but only abnormal results are displayed) Labs Reviewed - No data to display  EKG None  Radiology No results found.  Procedures Procedures   Medications Ordered in ED Medications - No data to display  ED Course  I have reviewed the triage vital signs and the nursing notes.  Pertinent labs & imaging results that were available during my care of the patient were reviewed by me and considered in my medical decision making (see chart for details).    MDM Rules/Calculators/A&P                           Patient presenting with a few day h/o viral symptoms.  Physical exam reassuring, patient is afebrile and appears nontoxic.  Pulmonary exam reassuring.  Doubt pneumonia, strep, other bacterial infection, or peritonsillar abscess.  Offered COVID and flu testing in the ER, patient decline.  Will treat symptomatically.  Patient to follow-up with primary care as needed.  At this time, patient appears safe for discharge.  Return precautions given.  Patient states she understands and agrees  to plan.   Final Clinical Impression(s) / ED Diagnoses Final diagnoses:  COVID-19    Rx / DC Orders ED Discharge Orders          Ordered    ondansetron (ZOFRAN ODT) 4 MG disintegrating tablet  Every 8 hours PRN        01/02/21 1524             Anara Cowman, PA-C 01/02/21 1536    Mancel Bale, MD 01/03/21 1724

## 2021-03-23 ENCOUNTER — Encounter (HOSPITAL_COMMUNITY): Payer: Self-pay

## 2021-03-23 ENCOUNTER — Emergency Department (HOSPITAL_COMMUNITY)
Admission: EM | Admit: 2021-03-23 | Discharge: 2021-03-23 | Disposition: A | Discharge status: Home / Self Care | Payer: Medicaid Other | Attending: Student | Admitting: Student

## 2021-03-23 DIAGNOSIS — R112 Nausea with vomiting, unspecified: Secondary | ICD-10-CM | POA: Diagnosis present

## 2021-03-23 DIAGNOSIS — U071 COVID-19: Secondary | ICD-10-CM | POA: Insufficient documentation

## 2021-03-23 DIAGNOSIS — F17210 Nicotine dependence, cigarettes, uncomplicated: Secondary | ICD-10-CM | POA: Insufficient documentation

## 2021-03-23 DIAGNOSIS — J45909 Unspecified asthma, uncomplicated: Secondary | ICD-10-CM | POA: Diagnosis not present

## 2021-03-23 MED ORDER — ONDANSETRON 4 MG PO TBDP
4.0000 mg | ORAL_TABLET | Freq: Once | ORAL | Status: AC
  Administered 2021-03-23: 4 mg via ORAL
  Filled 2021-03-23: qty 1

## 2021-03-23 MED ORDER — LOPERAMIDE HCL 2 MG PO CAPS: 2.0000 mg | ORAL_CAPSULE | Freq: Four times a day (QID) | ORAL | 0 refills | Status: DC | PRN

## 2021-03-23 MED ORDER — ONDANSETRON HCL 4 MG PO TABS: 4.0000 mg | ORAL_TABLET | Freq: Four times a day (QID) | ORAL | 0 refills | Status: DC

## 2021-03-23 NOTE — ED Provider Notes (Signed)
[facility]  CSN: [id number] Arrival date & time: 03/23/21 1814  Chief Complaint(s) Covid Positive and Vomiting  HPI Laura Callahan is a 21 y.o. female who presents emergency department for evaluation of nausea vomiting and diarrhea in the setting of a known COVID-19 infection.  Patient states that she has had symptoms for the last 5 days and that she tested positive for COVID 3 days ago.  She states that she is having trouble tolerating p.o. due to persistent vomiting and nonbloody diarrhea.  She has tried Dramamine without effect.  Denies chest pain, shortness of breath, headache, fever or other systemic symptoms.  HPI  Past Medical History Past Medical History:  Diagnosis Date   Asthma    Attention deficit    Bipolar 1 disorder (HCC)    Depression    Headache(784.0)    Normal pregnancy in third trimester 08/27/2016   SVD (spontaneous vaginal delivery) 08/27/2016   Patient Active Problem List   Diagnosis Date Noted   Preterm labor 06/19/2018   Preterm labor in third trimester 06/19/2018   Left against medical advice 06/17/2018   Preterm uterine contractions in third trimester, antepartum 06/16/2018   Influenza A 05/01/2018   Normal pregnancy in third trimester 08/27/2016   SVD (spontaneous vaginal delivery) 08/27/2016   Indication for care in labor or delivery 08/26/2016   Hypoglycemia 02/12/2013   PTSD (post-traumatic stress disorder) 02/09/2012   ADHD (attention deficit hyperactivity disorder), combined type 02/09/2012   Conduct disorder, 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 Authorizing Provider  ibuprofen (ADVIL) 600 MG tablet Take 1 tablet (600 mg total) by mouth every 6 (six) hours as needed for mild pain or moderate pain. 06/21/18   Myna Hidalgozan, Jennifer, DO  loperamide (IMODIUM) 2 MG capsule Take 1 capsule (2 mg total) by mouth 4  (four) times daily as needed for diarrhea or loose stools. 03/23/21   Arlis Yale, MD  ondansetron (ZOFRAN ODT) 4 MG disintegrating tablet Take 1 tablet (4 mg total) by mouth every 8 (eight) hours as needed for nausea or vomiting. 01/02/21   Caccavale, Sophia, PA-C  ondansetron (ZOFRAN) 4 MG tablet Take 1 tablet (4 mg total) by mouth every 6 (six) hours. 03/23/21   Isabell Bonafede, MD  Prenatal Vit-Fe Fumarate-FA (PRENATAL MULTIVITAMIN) TABS tablet Take 1 tablet by mouth daily at 12 noon.    [provider]                                                                                                                                    Past Surgical History Past Surgical History:  Procedure Laterality Date   addenoidectomy     TONSILLECTOMY     Family History Family History  Problem Relation Age of Onset   Depression Sister    Depression Mother  Anxiety disorder Mother    Drug abuse Mother    Anxiety disorder Sister    Drug abuse Cousin     Social History Social History   Tobacco Use   Smoking status: Every Day    Packs/day: 0.00    Types: Cigarettes    Last attempt to quit: 12/02/2017    Years since quitting: 3.3   Smokeless tobacco: Never  Substance Use Topics   Alcohol use: No   Drug use: Not Currently    Types: Marijuana    Comment: last used Jan 2020   Allergies Flax seed oil [bio-flax], Clonidine derivatives, Red dye, and Codeine  Review of Systems Review of Systems  Gastrointestinal:  Positive for diarrhea, nausea and vomiting.   Physical Exam Vital Signs  I have reviewed the triage vital signs BP 123/68 (BP Location: Right Arm)    Pulse 77    Temp 99.1 F (37.3 C)    Resp 17    Ht 5\' 6"  (1.676 m)    Wt 69 kg    SpO2 100%    BMI 24.55 kg/m   Physical Exam Vitals and nursing note reviewed.  Constitutional:      General: She is not in acute distress.    Appearance: She is well-developed.  HENT:     Head: Normocephalic and atraumatic.  Eyes:      Conjunctiva/sclera: Conjunctivae normal.  Cardiovascular:     Rate and Rhythm: Normal rate and regular rhythm.     Heart sounds: No murmur heard. Pulmonary:     Effort: Pulmonary effort is normal. No respiratory distress.     Breath sounds: Normal breath sounds.  Abdominal:     Palpations: Abdomen is soft.     Tenderness: There is no abdominal tenderness.  Musculoskeletal:        General: No swelling.     Cervical back: Neck supple.  Skin:    General: Skin is warm and dry.     Capillary Refill: Capillary refill takes less than 2 seconds.  Neurological:     Mental Status: She is alert.  Psychiatric:        Mood and Affect: Mood normal.    ED Results and Treatments Labs (all labs ordered are listed, but only abnormal results are displayed) Labs Reviewed - No data to display                                                                                                                        Radiology No results found.  Pertinent labs & imaging results that were available during my care of the patient were reviewed by me and considered in my medical decision making (see MDM for details).  Medications Ordered in ED Medications  ondansetron (ZOFRAN-ODT) disintegrating tablet 4 mg (has no administration in time range)  Procedures Procedures  (including critical care time)  Medical Decision Making / ED Course   This patient presents to the ED for concern of vomiting and diarrhea in the setting of COVID-19, this involves an extensive number of treatment options, and is a complaint that carries with it a high risk of complications and morbidity.  The differential diagnosis includes COVID-19, viral gastroenteritis, food poisoning  MDM: Patient seen emergency department for evaluation of nausea vomiting and diarrhea in the setting of a known  COVID-19 infection.  Physical exam is unremarkable.  Patient hemodynamically stable and without appreciable abdominal tenderness on exam patient has not required detailed medical work-up.  Last known menstrual period was 2 weeks ago and I have low suspicion for pregnancy.  She was given her first dose of ODT Zofran here in the emergency department and Zofran and Imodium were sent to her pharmacy.  She was given return precautions of which she voiced understanding and she was discharged.   Additional history obtained:  -External records from outside source obtained and reviewed including: Chart review including previous notes, labs, imaging, consultation notes   Medicines ordered and prescription drug management: Meds ordered this encounter  Medications   ondansetron (ZOFRAN-ODT) disintegrating tablet 4 mg   DISCONTD: ondansetron (ZOFRAN) 4 MG tablet    Sig: Take 1 tablet (4 mg total) by mouth every 6 (six) hours.    Dispense:  21 tablet    Refill:  0   DISCONTD: loperamide (IMODIUM) 2 MG capsule    Sig: Take 1 capsule (2 mg total) by mouth 4 (four) times daily as needed for diarrhea or loose stools.    Dispense:  12 capsule    Refill:  0   loperamide (IMODIUM) 2 MG capsule    Sig: Take 1 capsule (2 mg total) by mouth 4 (four) times daily as needed for diarrhea or loose stools.    Dispense:  12 capsule    Refill:  0   ondansetron (ZOFRAN) 4 MG tablet    Sig: Take 1 tablet (4 mg total) by mouth every 6 (six) hours.    Dispense:  21 tablet    Refill:  0    -I have reviewed the patients home medicines and have made adjustments as needed  Critical interventions none    Cardiac Monitoring: The patient was maintained on a cardiac monitor.  I personally viewed and interpreted the cardiac monitored which showed an underlying rhythm of: nsr  Social Determinants of Health:  Factors impacting patients care include: college studnet   Reevaluation: After the interventions noted above, I  reevaluated the patient and found that they have :improved  Co morbidities that complicate the patient evaluation  Past Medical History:  Diagnosis Date   Asthma    Attention deficit    Bipolar 1 disorder (HCC)    Depression    Headache(784.0)    Normal pregnancy in third trimester 08/27/2016   SVD (spontaneous vaginal delivery) 08/27/2016      Dispostion: I considered admission for this patient, but she is able to tolerate p.o. after Zofran administration and is hemodynamically stable here in the emergency department.  She does not meet admission criteria.     Final Clinical Impression(s) / ED Diagnoses Final diagnoses:  COVID-19     @PCDICTATION @    , MD 03/23/21 2135

## 2021-03-23 NOTE — ED Notes (Signed)
Pt called for vital signs x3 with no respons

## 2021-03-23 NOTE — ED Notes (Signed)
Pt stated that they were going to step out to their car and would return

## 2021-03-23 NOTE — ED Triage Notes (Signed)
Pt arrives POV for eval of covid w/ nausea vomiting. Pt reports N/V/D x 2 days. Pt reports she would like treatment for vomiting and diarrhea

## 2021-04-09 ENCOUNTER — Other Ambulatory Visit: Payer: Self-pay

## 2021-04-09 ENCOUNTER — Encounter (HOSPITAL_COMMUNITY): Payer: Self-pay | Admitting: Emergency Medicine

## 2021-04-09 ENCOUNTER — Emergency Department (HOSPITAL_COMMUNITY)
Admission: EM | Admit: 2021-04-09 | Discharge: 2021-04-09 | Attending: Emergency Medicine | Admitting: Emergency Medicine

## 2021-04-09 DIAGNOSIS — R112 Nausea with vomiting, unspecified: Secondary | ICD-10-CM | POA: Insufficient documentation

## 2021-04-09 DIAGNOSIS — R197 Diarrhea, unspecified: Secondary | ICD-10-CM | POA: Insufficient documentation

## 2021-04-09 DIAGNOSIS — R109 Unspecified abdominal pain: Secondary | ICD-10-CM | POA: Insufficient documentation

## 2021-04-09 LAB — BASIC METABOLIC PANEL
Anion gap: 10 (ref 5–15)
BUN: 13 mg/dL (ref 6–20)
CO2: 27 mmol/L (ref 22–32)
Calcium: 9.8 mg/dL (ref 8.9–10.3)
Chloride: 106 mmol/L (ref 98–111)
Creatinine, Ser: 0.61 mg/dL (ref 0.44–1.00)
GFR, Estimated: >60 mL/min (ref >60)
Glucose, Bld: 130 mg/dL — ABNORMAL HIGH (ref 70–99)
Potassium: 3.4 mmol/L — ABNORMAL LOW (ref 3.5–5.1)
Sodium: 143 mmol/L (ref 135–145)

## 2021-04-09 LAB — CBC
HCT: 39.8 % (ref 36.0–46.0)
Hemoglobin: 13.7 g/dL (ref 12.0–15.0)
MCH: 31.8 pg (ref 26.0–34.0)
MCHC: 34.4 g/dL (ref 30.0–36.0)
MCV: 92.3 fL (ref 80.0–100.0)
Platelets: 313 10*3/uL (ref 150–400)
RBC: 4.31 MIL/uL (ref 3.87–5.11)
RDW: 12.9 % (ref 11.5–15.5)
WBC: 10.6 10*3/uL — ABNORMAL HIGH (ref 4.0–10.5)
nRBC: 0 % (ref 0.0–0.2)

## 2021-04-09 LAB — URINALYSIS, ROUTINE W REFLEX MICROSCOPIC
Bilirubin Urine: NEGATIVE
Glucose, UA: NEGATIVE mg/dL
Hgb urine dipstick: NEGATIVE
Ketones, ur: 80 mg/dL — AB
Leukocytes,Ua: NEGATIVE
Nitrite: NEGATIVE
Protein, ur: 100 mg/dL — AB
Specific Gravity, Urine: 1.032 — ABNORMAL HIGH (ref 1.005–1.030)
pH: 6 (ref 5.0–8.0)

## 2021-04-09 LAB — HEPATIC FUNCTION PANEL
ALT: 21 U/L (ref 0–44)
AST: 24 U/L (ref 15–41)
Albumin: 5 g/dL (ref 3.5–5.0)
Alkaline Phosphatase: 50 U/L (ref 38–126)
Bilirubin, Direct: 0.1 mg/dL (ref 0.0–0.2)
Indirect Bilirubin: 0.9 mg/dL (ref 0.3–0.9)
Total Bilirubin: 1 mg/dL (ref 0.3–1.2)
Total Protein: 8.1 g/dL (ref 6.5–8.1)

## 2021-04-09 LAB — RAPID URINE DRUG SCREEN, HOSP PERFORMED
Amphetamines: NOT DETECTED
Barbiturates: NOT DETECTED
Benzodiazepines: NOT DETECTED
Cocaine: NOT DETECTED
Opiates: NOT DETECTED
Tetrahydrocannabinol: POSITIVE — AB

## 2021-04-09 LAB — LIPASE, BLOOD: Lipase: 25 U/L (ref 11–51)

## 2021-04-09 LAB — I-STAT BETA HCG BLOOD, ED (MC, WL, AP ONLY): I-stat hCG, quantitative: <5 m[IU]/mL (ref <5)

## 2021-04-09 MED ORDER — LORAZEPAM 2 MG/ML IJ SOLN
0.5000 mg | Freq: Once | INTRAMUSCULAR | Status: AC
  Administered 2021-04-09: 0.5 mg via INTRAVENOUS
  Filled 2021-04-09: qty 1

## 2021-04-09 MED ORDER — SODIUM CHLORIDE 0.9 % IV SOLN
12.5000 mg | Freq: Four times a day (QID) | INTRAVENOUS | Status: DC | PRN
  Administered 2021-04-09: 12.5 mg via INTRAVENOUS
  Filled 2021-04-09: qty 12.5

## 2021-04-09 MED ORDER — DROPERIDOL 2.5 MG/ML IJ SOLN
1.2500 mg | Freq: Once | INTRAMUSCULAR | Status: AC
  Administered 2021-04-09: 1.25 mg via INTRAMUSCULAR
  Filled 2021-04-09: qty 2

## 2021-04-09 MED ORDER — SODIUM CHLORIDE 0.9 % IV BOLUS
1000.0000 mL | Freq: Once | INTRAVENOUS | Status: AC
  Administered 2021-04-09: 1000 mL via INTRAVENOUS

## 2021-04-09 MED ORDER — ONDANSETRON HCL 4 MG PO TABS: 4.0000 mg | ORAL_TABLET | Freq: Four times a day (QID) | ORAL | 0 refills | Status: DC

## 2021-04-09 MED ORDER — KETOROLAC TROMETHAMINE 15 MG/ML IJ SOLN
15.0000 mg | Freq: Once | INTRAMUSCULAR | Status: AC
  Administered 2021-04-09: 15 mg via INTRAVENOUS
  Filled 2021-04-09: qty 1

## 2021-04-09 MED ORDER — ONDANSETRON HCL 4 MG/2ML IJ SOLN
4.0000 mg | Freq: Once | INTRAMUSCULAR | Status: AC
  Administered 2021-04-09: 4 mg via INTRAVENOUS
  Filled 2021-04-09: qty 2

## 2021-04-09 NOTE — ED Notes (Signed)
Pt [facility]. Pt verbalized understanding of discharge instructions.

## 2021-04-09 NOTE — ED Notes (Signed)
Pt given ginger ale to drink. 

## 2021-04-09 NOTE — ED Provider Notes (Signed)
[facility] DEPT Provider Note   CSN: [id number] Arrival date & time: 04/09/21  0351     History  Chief Complaint  Patient presents with   Abdominal Pain    Laura Callahan is a 21 y.o. female.  21 year old female with prior medical history as detailed below who presents for evaluation.  Patient reports cute onset of nausea, vomiting, and loose watery diarrhea.  Symptoms began overnight.  Patient is currently in jail.  She arrives with Librarian, academic.  She complains of crampy abdominal pain as well.  She denies fever.  She denies bloody emesis or blood in stool.  The history is provided by the patient and medical records.  Illness Location:  Nausea, vomiting, diarrhea Severity:  Moderate Onset quality:  Sudden Duration:  1 day Timing:  Constant Progression:  Worsening Chronicity:  New     Home Medications Prior to Admission medications   Medication Sig Start Date End Date Taking? Authorizing Provider  ibuprofen (ADVIL) 600 MG tablet Take 1 tablet (600 mg total) by mouth every 6 (six) hours as needed for mild pain or moderate pain. 06/21/18   Janyth Pupa, DO  loperamide (IMODIUM) 2 MG capsule Take 1 capsule (2 mg total) by mouth 4 (four) times daily as needed for diarrhea or loose stools. 03/23/21   Kommor, Madison, MD  ondansetron (ZOFRAN ODT) 4 MG disintegrating tablet Take 1 tablet (4 mg total) by mouth every 8 (eight) hours as needed for nausea or vomiting. 01/02/21   Caccavale, Sophia, PA-C  ondansetron (ZOFRAN) 4 MG tablet Take 1 tablet (4 mg total) by mouth every 6 (six) hours. 03/23/21   Kommor, Madison, MD  Prenatal Vit-Fe Fumarate-FA (PRENATAL MULTIVITAMIN) TABS tablet Take 1 tablet by mouth daily at 12 noon.    [provider]      Allergies    Flax seed oil [bio-flax], Clonidine derivatives, Red dye, and Codeine    Review of Systems   Review of Systems  All other systems reviewed and are negative.  Physical  Exam Updated Vital Signs BP 118/69    Pulse (!) 57    Temp 99.3 F (37.4 C) (Oral)    Resp 19    Ht 5\' 5"  (1.651 m)    Wt 59 kg    SpO2 100%    BMI 21.63 kg/m  Physical Exam Vitals and nursing note reviewed.  Constitutional:      General: She is not in acute distress.    Appearance: Normal appearance. She is well-developed.  HENT:     Head: Normocephalic and atraumatic.  Eyes:     Conjunctiva/sclera: Conjunctivae normal.     Pupils: Pupils are equal, round, and reactive to light.  Cardiovascular:     Rate and Rhythm: Normal rate and regular rhythm.     Heart sounds: Normal heart sounds.  Pulmonary:     Effort: Pulmonary effort is normal. No respiratory distress.     Breath sounds: Normal breath sounds.  Abdominal:     General: There is no distension.     Palpations: Abdomen is soft.     Tenderness: There is no abdominal tenderness.  Musculoskeletal:        General: No deformity. Normal range of motion.     Cervical back: Normal range of motion and neck supple.  Skin:    General: Skin is warm and dry.  Neurological:     General: No focal deficit present.     Mental Status: She is  alert and oriented to person, place, and time.    ED Results / Procedures / Treatments   Labs (all labs ordered are listed, but only abnormal results are displayed) Labs Reviewed  BASIC METABOLIC PANEL - Abnormal; Notable for the following components:      Result Value   Potassium 3.4 (*)    Glucose, Bld 130 (*)    All other components within normal limits  CBC - Abnormal; Notable for the following components:   WBC 10.6 (*)    All other components within normal limits  LIPASE, BLOOD  HEPATIC FUNCTION PANEL  URINALYSIS, ROUTINE W REFLEX MICROSCOPIC  RAPID URINE DRUG SCREEN, HOSP PERFORMED  I-STAT BETA HCG BLOOD, ED ([facility], [facility], [facility] ONLY)    EKG None  Radiology No results found.  Procedures Procedures    Medications Ordered in ED Medications  promethazine (PHENERGAN) 12.5 mg in  sodium chloride 0.9 % 50 mL IVPB (has no administration in time range)  sodium chloride 0.9 % bolus 1,000 mL (1,000 mLs Intravenous New Bag/Given 04/09/21 0750)  ondansetron (ZOFRAN) injection 4 mg (4 mg Intravenous Given 04/09/21 0750)  ketorolac (TORADOL) 15 MG/ML injection 15 mg (15 mg Intravenous Given 04/09/21 0750)    ED Course/ Medical Decision Making/ A&P                           Medical Decision Making Amount and/or Complexity of Data Reviewed Labs: ordered.  Risk Prescription drug management.    Medical Screen Complete  This patient presented to the ED with complaint of nausea, vomiting, diarrhea.  This complaint involves an extensive number of treatment options. The initial differential diagnosis includes, but is not limited to, metabolic abnormality, gastroenteritis,etc  This presentation is: Acute, Self-Limited, Previously Undiagnosed, Uncertain Prognosis, and Complicated  Patient is presenting with nausea, vomiting, and diarrhea.  After IV fluids and several rounds of antiemetics she feels improved.  She is taking p.o.  She will be discharged back to custody of law enforcement and escorted back to jail.  Work-up obtained is without evidence of significant acute pathology.    Additional history obtained:  External records from outside sources obtained and reviewed including prior ED visits and prior Inpatient records.    Lab Tests:  I ordered and personally interpreted labs.  The pertinent results include: CBC, BMP, hepatic function test, lipase, UA    Cardiac Monitoring:  The patient was maintained on a cardiac monitor.  I personally viewed and interpreted the cardiac monitor which showed an underlying rhythm of: NSR   Medicines ordered:  I ordered medication including zofran, phenergan  for nausea  Reevaluation of the patient after these medicines showed that the patient: improved   Problem List / ED Course:  N/V/D   Reevaluation:  After the  interventions noted above, I reevaluated the patient and found that they have: improved   Social Determinants of Health:  Incarcerated   Disposition:  After consideration of the diagnostic results and the patients response to treatment, I feel that the patent would benefit from close outpatient followup.          Final Clinical Impression(s) / ED Diagnoses Final diagnoses:  Nausea and vomiting, unspecified vomiting type  Diarrhea, unspecified type    Rx / DC Orders ED Discharge Orders     None         Valarie Merino, MD 04/09/21 [phone]

## 2021-04-09 NOTE — Discharge Instructions (Addendum)
Return for any problem.  ?

## 2021-04-09 NOTE — ED Triage Notes (Signed)
Patient BIB EMS from [facility]. Pt reports abdominal pain 10/10 in all quadrants. Pt has been experiencing multiple episodes of vomiting and diarrhea. Per EMS patient seen in January and found to be in withdrawal.

## 2022-07-26 ENCOUNTER — Emergency Department (HOSPITAL_COMMUNITY)
Admission: EM | Admit: 2022-07-26 | Discharge: 2022-07-26 | Discharge status: Against Medical Advice | Payer: Medicaid Other | Attending: Emergency Medicine | Admitting: Emergency Medicine

## 2022-07-26 NOTE — ED Notes (Signed)
Pt registered then immediately left. She was calling several times without answer. Reported she left in a vehicle.

## 2022-10-15 ENCOUNTER — Encounter (HOSPITAL_COMMUNITY): Payer: Self-pay

## 2022-10-15 ENCOUNTER — Other Ambulatory Visit: Payer: Self-pay

## 2022-10-15 ENCOUNTER — Emergency Department (HOSPITAL_COMMUNITY)
Admission: EM | Admit: 2022-10-15 | Discharge: 2022-10-16 | Disposition: A | Discharge status: Home / Self Care | Payer: 59 | Attending: Emergency Medicine | Admitting: Emergency Medicine

## 2022-10-15 DIAGNOSIS — F13239 Sedative, hypnotic or anxiolytic dependence with withdrawal, unspecified: Secondary | ICD-10-CM | POA: Diagnosis not present

## 2022-10-15 DIAGNOSIS — O99011 Anemia complicating pregnancy, first trimester: Secondary | ICD-10-CM | POA: Insufficient documentation

## 2022-10-15 DIAGNOSIS — Z3491 Encounter for supervision of normal pregnancy, unspecified, first trimester: Secondary | ICD-10-CM

## 2022-10-15 DIAGNOSIS — O99321 Drug use complicating pregnancy, first trimester: Secondary | ICD-10-CM | POA: Insufficient documentation

## 2022-10-15 DIAGNOSIS — F13939 Sedative, hypnotic or anxiolytic use, unspecified with withdrawal, unspecified: Secondary | ICD-10-CM

## 2022-10-15 DIAGNOSIS — J45909 Unspecified asthma, uncomplicated: Secondary | ICD-10-CM | POA: Insufficient documentation

## 2022-10-15 LAB — COMPREHENSIVE METABOLIC PANEL
ALT: 13 U/L (ref 0–44)
AST: 18 U/L (ref 15–41)
Albumin: 4.3 g/dL (ref 3.5–5.0)
Alkaline Phosphatase: 51 U/L (ref 38–126)
Anion gap: 8 (ref 5–15)
BUN: 7 mg/dL (ref 6–20)
CO2: 27 mmol/L (ref 22–32)
Calcium: 9.3 mg/dL (ref 8.9–10.3)
Chloride: 104 mmol/L (ref 98–111)
Creatinine, Ser: 0.52 mg/dL (ref 0.44–1.00)
GFR, Estimated: >60 mL/min (ref >60)
Glucose, Bld: 82 mg/dL (ref 70–99)
Potassium: 3.4 mmol/L — ABNORMAL LOW (ref 3.5–5.1)
Sodium: 139 mmol/L (ref 135–145)
Total Bilirubin: 0.5 mg/dL (ref 0.3–1.2)
Total Protein: 7.2 g/dL (ref 6.5–8.1)

## 2022-10-15 LAB — CBC
HCT: 36.8 % (ref 36.0–46.0)
Hemoglobin: 11.8 g/dL — ABNORMAL LOW (ref 12.0–15.0)
MCH: 29.7 pg (ref 26.0–34.0)
MCHC: 32.1 g/dL (ref 30.0–36.0)
MCV: 92.7 fL (ref 80.0–100.0)
Platelets: 223 10*3/uL (ref 150–400)
RBC: 3.97 MIL/uL (ref 3.87–5.11)
RDW: 13.2 % (ref 11.5–15.5)
WBC: 7.4 10*3/uL (ref 4.0–10.5)
nRBC: 0 % (ref 0.0–0.2)

## 2022-10-15 LAB — HCG, SERUM, QUALITATIVE: Preg, Serum: POSITIVE — AB

## 2022-10-15 LAB — ACETAMINOPHEN LEVEL: Acetaminophen (Tylenol), Serum: <10 ug/mL — ABNORMAL LOW (ref 10–30)

## 2022-10-15 LAB — CBG MONITORING, ED: Glucose-Capillary: 142 mg/dL — ABNORMAL HIGH (ref 70–99)

## 2022-10-15 LAB — ETHANOL: Alcohol, Ethyl (B): <10 mg/dL (ref <10)

## 2022-10-15 LAB — SALICYLATE LEVEL: Salicylate Lvl: <7 mg/dL — ABNORMAL LOW (ref 7.0–30.0)

## 2022-10-15 MED ORDER — ONDANSETRON HCL 4 MG/2ML IJ SOLN
4.0000 mg | Freq: Once | INTRAMUSCULAR | Status: AC
  Administered 2022-10-15: 4 mg via INTRAVENOUS
  Filled 2022-10-15: qty 2

## 2022-10-15 MED ORDER — METHOCARBAMOL 1000 MG/10ML IJ SOLN
1000.0000 mg | Freq: Once | INTRAMUSCULAR | Status: DC
  Filled 2022-10-15: qty 10

## 2022-10-15 MED ORDER — SODIUM CHLORIDE 0.9 % IV BOLUS
1000.0000 mL | Freq: Once | INTRAVENOUS | Status: AC
  Administered 2022-10-15: 1000 mL via INTRAVENOUS

## 2022-10-15 NOTE — ED Triage Notes (Signed)
Pt. Arrives POV for fentanyl withdrawals. Pt. States that she is having nausea and vomiting, muscle soreness, and irritability. Last use was "about 5-6 hours ago."

## 2022-10-16 DIAGNOSIS — O99321 Drug use complicating pregnancy, first trimester: Secondary | ICD-10-CM | POA: Diagnosis not present

## 2022-10-16 LAB — RAPID URINE DRUG SCREEN, HOSP PERFORMED
Amphetamines: NOT DETECTED
Barbiturates: NOT DETECTED
Benzodiazepines: POSITIVE — AB
Cocaine: POSITIVE — AB
Opiates: NOT DETECTED
Tetrahydrocannabinol: POSITIVE — AB

## 2022-10-16 LAB — URINALYSIS, ROUTINE W REFLEX MICROSCOPIC
Bacteria, UA: NONE SEEN
Bilirubin Urine: NEGATIVE
Glucose, UA: NEGATIVE mg/dL
Hgb urine dipstick: NEGATIVE
Ketones, ur: 80 mg/dL — AB
Leukocytes,Ua: NEGATIVE
Nitrite: NEGATIVE
Protein, ur: 30 mg/dL — AB
Specific Gravity, Urine: 1.032 — ABNORMAL HIGH (ref 1.005–1.030)
pH: 5 (ref 5.0–8.0)

## 2022-10-16 LAB — HCG, QUANTITATIVE, PREGNANCY: hCG, Beta Chain, Quant, S: 5225 m[IU]/mL — ABNORMAL HIGH (ref <5)

## 2022-10-16 MED ORDER — METHOCARBAMOL 500 MG PO TABS: 500.0000 mg | ORAL_TABLET | Freq: Two times a day (BID) | ORAL | 0 refills | Status: DC

## 2022-10-16 MED ORDER — METHOCARBAMOL 1000 MG/10ML IJ SOLN
1000.0000 mg | Freq: Once | INTRAVENOUS | Status: AC
  Administered 2022-10-16: 1000 mg via INTRAVENOUS
  Filled 2022-10-16: qty 1000

## 2022-10-16 MED ORDER — ONDANSETRON HCL 4 MG PO TABS: 4.0000 mg | ORAL_TABLET | Freq: Four times a day (QID) | ORAL | 0 refills | Status: DC

## 2022-10-16 NOTE — Discharge Instructions (Addendum)
Pregnancy-given you information about your pregnancy, please stay away from alcohol use, NSAID use, and follow-up with OB/GYN for further assessment Substance dependency-given you Zofran for nausea, muscle relaxers for spasms, given the outpatient resources for substance use disorder, as well as following up with behavioral health.  Come back to the emergency department if you develop chest pain, shortness of breath, severe abdominal pain, uncontrolled nausea, vomiting, diarrhea.

## 2022-10-16 NOTE — ED Provider Notes (Signed)
[facility] AT [facility] Provider Note   CSN: [id number] Arrival date & time: 10/15/22  1816     History  Chief Complaint  Patient presents with   Withdrawal    Laura Callahan is a 22 y.o. female.  HPI   Patient with medical history including depression, asthma, bipolar, presenting with complaints of withdrawals.  Patient states that she has been taking fentanyl, for last few weeks.  Patient states that she takes a pill which she was told is oxycodone but she believes that she has fentanyl, as she has had drug tests which showed that she only is positive for fentanyl.  She states these pills that she was taking are 30 mg oxycodones, she takes them 10 times daily.  She states she last took them about 10 hours prior to arrival.  She is endorsing chills, nausea vomiting and restless legs, she denies any chest pain or shortness of breath denies any actual stomach pain, denies any SI or HI, denies any hallucinations or delusions    Home Medications Prior to Admission medications   Medication Sig Start Date End Date Taking? Authorizing Provider  methocarbamol (ROBAXIN) 500 MG tablet Take 1 tablet (500 mg total) by mouth 2 (two) times daily. 10/16/22  Yes Carroll Sage, PA-C  ondansetron (ZOFRAN) 4 MG tablet Take 1 tablet (4 mg total) by mouth every 6 (six) hours. 10/16/22  Yes Carroll Sage, PA-C  ibuprofen (ADVIL) 600 MG tablet Take 1 tablet (600 mg total) by mouth every 6 (six) hours as needed for mild pain or moderate pain. Patient not taking: Reported on 10/16/2022 06/21/18   Myna Hidalgo, DO  loperamide (IMODIUM) 2 MG capsule Take 1 capsule (2 mg total) by mouth 4 (four) times daily as needed for diarrhea or loose stools. Patient not taking: Reported on 10/16/2022 03/23/21   Kommor, Wyn Forster, MD  ondansetron (ZOFRAN ODT) 4 MG disintegrating tablet Take 1 tablet (4 mg total) by mouth every 8 (eight) hours as needed for nausea or  vomiting. Patient not taking: Reported on 10/16/2022 01/02/21   Caccavale, Sophia, PA-C      Allergies    Flax seed oil [flax seed oil], Clonidine derivatives, Red dye #40 (allura red), and Codeine    Review of Systems   Review of Systems  Constitutional:  Positive for chills. Negative for fever.  Respiratory:  Negative for shortness of breath.   Cardiovascular:  Negative for chest pain.  Gastrointestinal:  Negative for abdominal pain.  Neurological:  Negative for headaches.    Physical Exam Updated Vital Signs BP 112/63 (BP Location: Right Arm)   Pulse 62   Temp 98.8 F (37.1 C) (Oral)   Resp 18   SpO2 99%  Physical Exam Vitals and nursing note reviewed.  Constitutional:      General: She is not in acute distress.    Appearance: She is not ill-appearing.  HENT:     Head: Normocephalic and atraumatic.     Nose: No congestion.  Eyes:     Conjunctiva/sclera: Conjunctivae normal.  Cardiovascular:     Rate and Rhythm: Normal rate and regular rhythm.     Pulses: Normal pulses.     Heart sounds: No murmur heard.    No friction rub. No gallop.  Pulmonary:     Effort: No respiratory distress.     Breath sounds: No wheezing, rhonchi or rales.  Abdominal:     Palpations: Abdomen is soft.     Tenderness:  There is no abdominal tenderness. There is no right CVA tenderness or left CVA tenderness.  Skin:    General: Skin is warm and dry.  Neurological:     Mental Status: She is alert.  Psychiatric:        Mood and Affect: Mood normal.     Comments: Patient makes good eye contact, has good insight, denies SI or HI, does not appear to be respond to internal stimuli.  Responding appropriate to all questions.     ED Results / Procedures / Treatments   Labs (all labs ordered are listed, but only abnormal results are displayed) Labs Reviewed  COMPREHENSIVE METABOLIC PANEL - Abnormal; Notable for the following components:      Result Value   Potassium 3.4 (*)    All other  components within normal limits  SALICYLATE LEVEL - Abnormal; Notable for the following components:   Salicylate Lvl <7.0 (*)    All other components within normal limits  ACETAMINOPHEN LEVEL - Abnormal; Notable for the following components:   Acetaminophen (Tylenol), Serum <10 (*)    All other components within normal limits  CBC - Abnormal; Notable for the following components:   Hemoglobin 11.8 (*)    All other components within normal limits  RAPID URINE DRUG SCREEN, HOSP PERFORMED - Abnormal; Notable for the following components:   Cocaine POSITIVE (*)    Benzodiazepines POSITIVE (*)    Tetrahydrocannabinol POSITIVE (*)    All other components within normal limits  HCG, SERUM, QUALITATIVE - Abnormal; Notable for the following components:   Preg, Serum POSITIVE (*)    All other components within normal limits  HCG, QUANTITATIVE, PREGNANCY - Abnormal; Notable for the following components:   hCG, Beta Chain, Quant, S 5,225 (*)    All other components within normal limits  URINALYSIS, ROUTINE W REFLEX MICROSCOPIC - Abnormal; Notable for the following components:   APPearance HAZY (*)    Specific Gravity, Urine 1.032 (*)    Ketones, ur 80 (*)    Protein, ur 30 (*)    All other components within normal limits  CBG MONITORING, ED - Abnormal; Notable for the following components:   Glucose-Capillary 142 (*)    All other components within normal limits  ETHANOL    EKG EKG Interpretation Date/Time:  Friday October 15 2022 22:14:40 EDT Ventricular Rate:  55 PR Interval:  132 QRS Duration:  99 QT Interval:  455 QTC Calculation: 436 R Axis:   57  Text Interpretation: Sinus rhythm Baseline wander in lead(s) V2 No significant change since last tracing Confirmed by Linwood Dibbles [phone]) on 10/15/2022 10:18:55 PM  Radiology No results found.  Procedures Procedures    Medications Ordered in ED Medications  sodium chloride 0.9 % bolus 1,000 mL (0 mLs Intravenous Stopped 10/16/22  0225)  ondansetron (ZOFRAN) injection 4 mg (4 mg Intravenous Given 10/15/22 2244)  methocarbamol (ROBAXIN) 1,000 mg in dextrose 5 % 100 mL IVPB (0 mg Intravenous Stopped 10/16/22 0119)    ED Course/ Medical Decision Making/ A&P                                 Medical Decision Making Amount and/or Complexity of Data Reviewed Labs: ordered.  Risk Prescription drug management.   This patient presents to the ED for concern of withdrawal, this involves an extensive number of treatment options, and is a complaint that carries with it a high risk of  complications and morbidity.  The differential diagnosis includes metabolic abnormality, acute withdrawal, psychiatric emergency    Additional history obtained:  Additional history obtained from N/A External records from outside source obtained and reviewed including recent ER notes   Co morbidities that complicate the patient evaluation  Polysubstance use  Social Determinants of Health:  No primary care provider    Lab Tests:  I Ordered, and personally interpreted labs.  The pertinent results include: CBC shows normocytic anemia hemoglobin 11.8, CMP reveals potassium 3.4, acetaminophen negative, systolic negative, hCG quantitative 5225, urine drug screen positive for cocaine, benzodiazepine, cannabis, UA is unremarkable   Imaging Studies ordered:  I ordered imaging studies including N/A I independently visualized and interpreted imaging which showed N/A I agree with the radiologist interpretation   Cardiac Monitoring:  The patient was maintained on a cardiac monitor.  I personally viewed and interpreted the cardiac monitored which showed an underlying rhythm of: Without signs of ischemia   Medicines ordered and prescription drug management:  I ordered medication including fluids, antiemetics, Robaxin I have reviewed the patients home medicines and have made adjustments as needed  Critical  Interventions:  N/A   Reevaluation:  Will obtain screening lab workup, continue to monitor  hCG is positive, reassessed the patient, states that she admits her last menstrual cycle, states she is regular, she is currently on oral birth control, no endorsing any pelvic pain or vaginal discharge vaginal bleeding, her abdomen soft nontender, will add on quantitative hCG, also discussed risk and benefits of muscle relaxers as there is minimal risk of fetal harm, patient states that she is fine with these risks and would like to proceed.  Patient was reassessed she is resting comfortably she is agreement discharge at this time.    Consultations Obtained:  N/a    Test Considered:  N/a    Rule out Suspicion for acute psychiatric emergency is low at this time denies SI or HI does not appear to be responding to internal stimuli.  I doubt patient needs to be admitted for metabolic abnormalities labs are unremarkable.  Suspicion that patient admitted for acute withdrawals is low she is nontremulous on my exam vital signs are reassuring she is tolerating p.o.  Suspicion for ectopic pregnancy is low at this time endorsing any abdominal pain she has no vaginal discharge no vaginal bleeding.    Dispostion and problem list  After consideration of the diagnostic results and the patients response to treatment, I feel that the patent would benefit from discharge.  Withdraws-will provide patient with antiemetics, muscle relaxers, refer her to behavioral health for further assessment First trimester pregnancy-patient will be given information in regards on first trimester pregnancy, will have her follow-up with OB/GYN for further assessment.            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

## 2022-10-19 ENCOUNTER — Encounter (HOSPITAL_COMMUNITY): Payer: Self-pay | Admitting: Emergency Medicine

## 2022-10-19 ENCOUNTER — Emergency Department (HOSPITAL_COMMUNITY)
Admission: EM | Admit: 2022-10-19 | Discharge: 2022-10-20 | Discharge status: Against Medical Advice | Payer: 59 | Attending: Physician Assistant | Admitting: Physician Assistant

## 2022-10-19 ENCOUNTER — Other Ambulatory Visit: Payer: Self-pay

## 2022-10-19 DIAGNOSIS — Z5321 Procedure and treatment not carried out due to patient leaving prior to being seen by health care provider: Secondary | ICD-10-CM | POA: Diagnosis not present

## 2022-10-19 DIAGNOSIS — F1123 Opioid dependence with withdrawal: Secondary | ICD-10-CM | POA: Diagnosis not present

## 2022-10-19 DIAGNOSIS — R1084 Generalized abdominal pain: Secondary | ICD-10-CM | POA: Insufficient documentation

## 2022-10-19 DIAGNOSIS — M791 Myalgia, unspecified site: Secondary | ICD-10-CM | POA: Insufficient documentation

## 2022-10-19 LAB — CBC WITH DIFFERENTIAL/PLATELET
Abs Immature Granulocytes: 0.01 10*3/uL (ref 0.00–0.07)
Basophils Absolute: 0 10*3/uL (ref 0.0–0.1)
Basophils Relative: 1 %
Eosinophils Absolute: 0 10*3/uL (ref 0.0–0.5)
Eosinophils Relative: 1 %
HCT: 33.7 % — ABNORMAL LOW (ref 36.0–46.0)
Hemoglobin: 11 g/dL — ABNORMAL LOW (ref 12.0–15.0)
Immature Granulocytes: 0 %
Lymphocytes Relative: 32 %
Lymphs Abs: 2.2 10*3/uL (ref 0.7–4.0)
MCH: 30.1 pg (ref 26.0–34.0)
MCHC: 32.6 g/dL (ref 30.0–36.0)
MCV: 92.3 fL (ref 80.0–100.0)
Monocytes Absolute: 0.5 10*3/uL (ref 0.1–1.0)
Monocytes Relative: 8 %
Neutro Abs: 4.1 10*3/uL (ref 1.7–7.7)
Neutrophils Relative %: 58 %
Platelets: 226 10*3/uL (ref 150–400)
RBC: 3.65 MIL/uL — ABNORMAL LOW (ref 3.87–5.11)
RDW: 13.2 % (ref 11.5–15.5)
WBC: 7 10*3/uL (ref 4.0–10.5)
nRBC: 0 % (ref 0.0–0.2)

## 2022-10-19 LAB — RAPID URINE DRUG SCREEN, HOSP PERFORMED
Amphetamines: NOT DETECTED
Barbiturates: NOT DETECTED
Benzodiazepines: NOT DETECTED
Cocaine: POSITIVE — AB
Opiates: POSITIVE — AB
Tetrahydrocannabinol: POSITIVE — AB

## 2022-10-19 LAB — COMPREHENSIVE METABOLIC PANEL
ALT: 14 U/L (ref 0–44)
AST: 18 U/L (ref 15–41)
Albumin: 3.7 g/dL (ref 3.5–5.0)
Alkaline Phosphatase: 41 U/L (ref 38–126)
Anion gap: 9 (ref 5–15)
BUN: 10 mg/dL (ref 6–20)
CO2: 24 mmol/L (ref 22–32)
Calcium: 9.3 mg/dL (ref 8.9–10.3)
Chloride: 103 mmol/L (ref 98–111)
Creatinine, Ser: 0.66 mg/dL (ref 0.44–1.00)
GFR, Estimated: >60 mL/min (ref >60)
Glucose, Bld: 92 mg/dL (ref 70–99)
Potassium: 3.6 mmol/L (ref 3.5–5.1)
Sodium: 136 mmol/L (ref 135–145)
Total Bilirubin: 0.6 mg/dL (ref 0.3–1.2)
Total Protein: 6.6 g/dL (ref 6.5–8.1)

## 2022-10-19 LAB — HCG, SERUM, QUALITATIVE: Preg, Serum: POSITIVE — AB

## 2022-10-19 NOTE — ED Triage Notes (Signed)
Patient here complains of heroin withdrawal last used at 0630 am today. Reports nausea, vomiting abdominal pain, muscle aching, restless. States she found out she was pregnant 3 days ago on 8/17 and is trying to stop using due to this. Aox4.

## 2022-10-19 NOTE — ED Provider Triage Note (Signed)
Emergency Medicine Provider Triage Evaluation Note  Laura Callahan , a 22 y.o. female  was evaluated in triage.  Pt complains of heroin withdrawals although she last used this morning, accompanied by family member.  Complaining of myalgias, generalized pain.  On Suboxone as well.  Review of Systems  Positive: myalgias Negative: Vomiting, chest, sob  Physical Exam  BP (!) 94/53 (BP Location: Right Arm)   Pulse (!) 44   Temp 98 F (36.7 C) (Oral)   Resp 16   Ht 5\' 5"  (1.651 m)   Wt 59 kg   SpO2 100%   BMI 21.64 kg/m  Gen:   Awake, no distress   Resp:  Normal effort  MSK:   Moves extremities without difficulty  Other:    Medical Decision Making  Medically screening exam initiated at 8:56 PM.  Appropriate orders placed.  Laura Callahan was informed that the remainder of the evaluation will be completed by another provider, this initial triage assessment does not replace that evaluation, and the importance of remaining in the ED until their evaluation is complete.     Claude Manges, PA-C 10/19/22 2056

## 2022-10-20 NOTE — ED Notes (Signed)
Patient was called back to triage and had left the hospital

## 2022-10-20 NOTE — ED Notes (Signed)
Pt called x2 for vitals and triage with no answer.

## 2022-11-09 ENCOUNTER — Ambulatory Visit (HOSPITAL_COMMUNITY): Admission: EM | Admit: 2022-11-09 | Discharge: 2022-11-09 | Discharge status: Against Medical Advice | Payer: 59

## 2022-11-09 DIAGNOSIS — F1110 Opioid abuse, uncomplicated: Secondary | ICD-10-CM

## 2022-11-09 DIAGNOSIS — F1410 Cocaine abuse, uncomplicated: Secondary | ICD-10-CM | POA: Diagnosis not present

## 2022-11-09 DIAGNOSIS — Z3491 Encounter for supervision of normal pregnancy, unspecified, first trimester: Secondary | ICD-10-CM

## 2022-11-09 NOTE — ED Provider Notes (Signed)
[facility] Continuous Assessment Admission H&P  Date: 11/10/22 Patient Name: Laura Callahan MRN: [id number] Chief Complaint: increase body aches, nausea and vomiting, restlessness can't keep anything down (food or drink).   Diagnoses:  Final diagnoses:  Cocaine abuse (HCC)  Heroin abuse (HCC)  Pregnant and not yet delivered in first trimester    HPI: Laura Callahan, 22 y/o female with a history of polysubstance use which include cocaine and heroin presented to [facility] voluntarily.  According to the patient she is experiencing withdrawals from heroin which she stated she used heroin every day and also cocaine every other day per the patient she is having nausea and vomiting, restlessness, aches, she cannot keep down any food.  And she is also pregnant.  Patient is currently not seeing a psychiatrist or therapist.  Currently not taking any medications.  According to patient she lives with her fianc and she is unemployed.  Face-to-face observation of patient, patient is alert and oriented x 4, speech is clear, maintained minimal eye contact.  Patient reports increased nausea and vomiting, aches, restlessness, and also reports she cannot keep any food down.  Patient is currently pregnant but is stated she do not know how far along she is.  Patient reports she used heroin every day and she also reports she used cocaine every other day.  Writer discussed with patient that we need to send her out to the ED for medical clearance because she is pregnant and having these complications and they will need to check her out to make sure everything is okay.  Patient stated she does not want to go to the hospital because she has been there and they can do nothing for her.  Patient stated she wants to leave because we have wasted her time.  I discussed with patient that we do not have the capability to monitor if something is wrong with the fetus so she need to be in the hospital especially because she is not  keeping down any food and she is having constant nausea and vomiting so we have to check to make sure she is not having other complications.  Patient got upset stating that she wants to leave because she is not going to the hospital.  Psychiatric the patient does not pose a risk to herself or others patient denies suicidal ideation and homicidal ideation denies AVH or paranoia. Pt does not appear to be influence by internal or external stimuli. Pt was advice by Clinical research associate that if she leave,  she will be leaving against medical advise.  However she is free to return should she experience and SI/HI or for any reason.  Pt verbalize that she wanted to leave.   Prior to patient wanting to leave writer already spoke with Dr. Cyril Mourning MD at [facility] who was expecting the patient.  Writer did consult with Dr.Syed Arfeen MD,  explain to him that pt didn't want to go to the ED and wanted to leave AMA.   Total Time spent with patient: 30 minutes  Musculoskeletal  Strength & Muscle Tone: within normal limits Gait & Station: normal Patient leans: N/A  Psychiatric Specialty Exam  Presentation General Appearance:  Casual  Eye Contact: Good  Speech: Clear and Coherent  Speech Volume: Normal  Handedness: Right   Mood and Affect  Mood: Anxious  Affect: Appropriate   Thought Process  Thought Processes: Coherent  Descriptions of Associations:Circumstantial  Orientation:Full (Time, Place and Person)  Thought Content:Logical    Hallucinations:Hallucinations: None  Ideas of Reference:None  Suicidal Thoughts:Suicidal Thoughts: No  Homicidal Thoughts:Homicidal Thoughts: No   Sensorium  Memory: Immediate Fair  Judgment: Fair  Insight: Fair   Art therapist  Concentration: Fair  Attention Span: Fair  Recall: Fair  Fund of Knowledge: Fair  Language: Fair   Psychomotor Activity  Psychomotor Activity: Psychomotor Activity: Normal   Assets   Assets: Desire for Improvement   Sleep  Sleep: Sleep: Fair Number of Hours of Sleep: 6   Nutritional Assessment (For OBS and FBC admissions only) Has the patient had a weight loss or gain of 10 pounds or more in the last 3 months?: No Has the patient had a decrease in food intake/or appetite?: Yes Does the patient have dental problems?: No Does the patient have eating habits or behaviors that may be indicators of an eating disorder including binging or inducing vomiting?: No Has the patient recently lost weight without trying?: 0 Has the patient been eating poorly because of a decreased appetite?: 0 Malnutrition Screening Tool Score: 0    Physical Exam HENT:     Head: Normocephalic.     Nose: Nose normal.  Eyes:     Pupils: Pupils are equal, round, and reactive to light.  Cardiovascular:     Rate and Rhythm: Normal rate.  Pulmonary:     Effort: Pulmonary effort is normal.  Musculoskeletal:        General: Normal range of motion.     Cervical back: Normal range of motion.  Neurological:     General: No focal deficit present.     Mental Status: She is alert.  Psychiatric:        Mood and Affect: Mood normal.        Behavior: Behavior normal.        Thought Content: Thought content normal.        Judgment: Judgment normal.    Review of Systems  Constitutional: Negative.   HENT: Negative.    Eyes: Negative.   Respiratory: Negative.    Cardiovascular: Negative.   Gastrointestinal: Negative.   Genitourinary: Negative.   Musculoskeletal: Negative.   Skin: Negative.   Neurological: Negative.   Psychiatric/Behavioral:  Positive for substance abuse. The patient is nervous/anxious.     Blood pressure 104/61, pulse (!) 51, temperature 97.8 F (36.6 C), temperature source Oral, resp. rate 16, SpO2 100%, unknown if currently breastfeeding. There is no height or weight on file to calculate BMI.  Past Psychiatric History: cocaine,  heroin abuse   Is the patient at  risk to self? No  Has the patient been a risk to self in the past 6 months? No .    Has the patient been a risk to self within the distant past? No   Is the patient a risk to others? No   Has the patient been a risk to others in the past 6 months? No   Has the patient been a risk to others within the distant past? No   Past Medical History: see chart   Family History: unknown  Social History: Heroin and Cocaine abuse  Last Labs:  Admission on 10/19/2022, Discharged on 10/20/2022  Component Date Value Ref Range Status   WBC 10/19/2022 7.0  4.0 - 10.5 K/uL Final   RBC 10/19/2022 3.65 (L)  3.87 - 5.11 MIL/uL Final   Hemoglobin 10/19/2022 11.0 (L)  12.0 - 15.0 g/dL Final   HCT 84/69/6295 33.7 (L)  36.0 - 46.0 % Final   MCV 10/19/2022 92.3  80.0 - 100.0 fL Final   MCH 10/19/2022 30.1  26.0 - 34.0 pg Final   MCHC 10/19/2022 32.6  30.0 - 36.0 g/dL Final   RDW 25/36/6440 13.2  11.5 - 15.5 % Final   Platelets 10/19/2022 226  150 - 400 K/uL Final   nRBC 10/19/2022 0.0  0.0 - 0.2 % Final   Neutrophils Relative % 10/19/2022 58  % Final   Neutro Abs 10/19/2022 4.1  1.7 - 7.7 K/uL Final   Lymphocytes Relative 10/19/2022 32  % Final   Lymphs Abs 10/19/2022 2.2  0.7 - 4.0 K/uL Final   Monocytes Relative 10/19/2022 8  % Final   Monocytes Absolute 10/19/2022 0.5  0.1 - 1.0 K/uL Final   Eosinophils Relative 10/19/2022 1  % Final   Eosinophils Absolute 10/19/2022 0.0  0.0 - 0.5 K/uL Final   Basophils Relative 10/19/2022 1  % Final   Basophils Absolute 10/19/2022 0.0  0.0 - 0.1 K/uL Final   Immature Granulocytes 10/19/2022 0  % Final   Abs Immature Granulocytes 10/19/2022 0.01  0.00 - 0.07 K/uL Final   Performed at [facility], [address] [place], [place] [address]   Sodium 10/19/2022 136  135 - 145 mmol/L Final   Potassium 10/19/2022 3.6  3.5 - 5.1 mmol/L Final   Chloride 10/19/2022 103  98 - 111 mmol/L Final   CO2 10/19/2022 24  22 - 32 mmol/L Final   Glucose, Bld 10/19/2022 92   70 - 99 mg/dL Final   Glucose reference range applies only to samples taken after fasting for at least 8 hours.   BUN 10/19/2022 10  6 - 20 mg/dL Final   Creatinine, Ser 10/19/2022 0.66  0.44 - 1.00 mg/dL Final   Calcium 59/56/3875 9.3  8.9 - 10.3 mg/dL Final   Total Protein 64/33/2951 6.6  6.5 - 8.1 g/dL Final   Albumin 88/41/6606 3.7  3.5 - 5.0 g/dL Final   AST 30/16/0109 18  15 - 41 U/L Final   ALT 10/19/2022 14  0 - 44 U/L Final   Alkaline Phosphatase 10/19/2022 41  38 - 126 U/L Final   Total Bilirubin 10/19/2022 0.6  0.3 - 1.2 mg/dL Final   GFR, Estimated 10/19/2022 >60  >60 mL/min Final   Comment: (NOTE) Calculated using the CKD-EPI Creatinine Equation (2021)    Anion gap 10/19/2022 9  5 - 15 Final   Performed at [facility], [address], [place], [place] [address]   Opiates 10/19/2022 POSITIVE (A)  NONE DETECTED Final   Cocaine 10/19/2022 POSITIVE (A)  NONE DETECTED Final   Benzodiazepines 10/19/2022 NONE DETECTED  NONE DETECTED Final   Amphetamines 10/19/2022 NONE DETECTED  NONE DETECTED Final   Tetrahydrocannabinol 10/19/2022 POSITIVE (A)  NONE DETECTED Final   Barbiturates 10/19/2022 NONE DETECTED  NONE DETECTED Final   Comment: (NOTE) DRUG SCREEN FOR MEDICAL PURPOSES ONLY.  IF CONFIRMATION IS NEEDED FOR ANY PURPOSE, NOTIFY LAB WITHIN 5 DAYS.  LOWEST DETECTABLE LIMITS FOR URINE DRUG SCREEN Drug Class                     Cutoff (ng/mL) Amphetamine and metabolites    1000 Barbiturate and metabolites    200 Benzodiazepine                 200 Opiates and metabolites        300 Cocaine and metabolites        300 THC  50 Performed at [facility], [address], [place], [place] [address]    Preg, Serum 10/19/2022 POSITIVE (A)  NEGATIVE Final   Comment:        THE SENSITIVITY OF THIS METHODOLOGY IS >10 mIU/mL. Performed at [facility], [address], [place], [place] [address]   Admission on 10/15/2022, Discharged  on 10/16/2022  Component Date Value Ref Range Status   Sodium 10/15/2022 139  135 - 145 mmol/L Final   Potassium 10/15/2022 3.4 (L)  3.5 - 5.1 mmol/L Final   Chloride 10/15/2022 104  98 - 111 mmol/L Final   CO2 10/15/2022 27  22 - 32 mmol/L Final   Glucose, Bld 10/15/2022 82  70 - 99 mg/dL Final   Glucose reference range applies only to samples taken after fasting for at least 8 hours.   BUN 10/15/2022 7  6 - 20 mg/dL Final   Creatinine, Ser 10/15/2022 0.52  0.44 - 1.00 mg/dL Final   Calcium 03/47/4259 9.3  8.9 - 10.3 mg/dL Final   Total Protein 56/38/7564 7.2  6.5 - 8.1 g/dL Final   Albumin 33/29/5188 4.3  3.5 - 5.0 g/dL Final   AST 41/66/0630 18  15 - 41 U/L Final   ALT 10/15/2022 13  0 - 44 U/L Final   Alkaline Phosphatase 10/15/2022 51  38 - 126 U/L Final   Total Bilirubin 10/15/2022 0.5  0.3 - 1.2 mg/dL Final   GFR, Estimated 10/15/2022 >60  >60 mL/min Final   Comment: (NOTE) Calculated using the CKD-EPI Creatinine Equation (2021)    Anion gap 10/15/2022 8  5 - 15 Final   Performed at [facility], [address], [place], [place] [address]   Alcohol, Ethyl (B) 10/15/2022 <10  <10 mg/dL Final   Comment: (NOTE) Lowest detectable limit for serum alcohol is 10 mg/dL.  For medical purposes only. Performed at [facility], [address] [place], [place] [address]    Salicylate Lvl 10/15/2022 <7.0 (L)  7.0 - 30.0 mg/dL Final   Performed at [facility], [address], [place], [place] [address]   Acetaminophen (Tylenol), Serum 10/15/2022 <10 (L)  10 - 30 ug/mL Final   Comment: (NOTE) Therapeutic concentrations vary significantly. A range of 10-30 ug/mL  may be an effective concentration for many patients. However, some  are best treated at concentrations outside of this range. Acetaminophen concentrations >150 ug/mL at 4 hours after ingestion  and >50 ug/mL at 12 hours after ingestion are often associated with  toxic  reactions.  Performed at [facility], [address], [place], [place] [address]    WBC 10/15/2022 7.4  4.0 - 10.5 K/uL Final   RBC 10/15/2022 3.97  3.87 - 5.11 MIL/uL Final   Hemoglobin 10/15/2022 11.8 (L)  12.0 - 15.0 g/dL Final   HCT 70/62/3762 36.8  36.0 - 46.0 % Final   MCV 10/15/2022 92.7  80.0 - 100.0 fL Final   MCH 10/15/2022 29.7  26.0 - 34.0 pg Final   MCHC 10/15/2022 32.1  30.0 - 36.0 g/dL Final   RDW 83/15/1761 13.2  11.5 - 15.5 % Final   Platelets 10/15/2022 223  150 - 400 K/uL Final   nRBC 10/15/2022 0.0  0.0 - 0.2 % Final   Performed at [facility], [address], [place], [place] [address]   Opiates 10/15/2022 NONE DETECTED  NONE DETECTED Final   Cocaine 10/15/2022 POSITIVE (A)  NONE DETECTED Final   Benzodiazepines 10/15/2022 POSITIVE (A)  NONE DETECTED Final   Amphetamines 10/15/2022 NONE DETECTED  NONE DETECTED Final   Tetrahydrocannabinol 10/15/2022 POSITIVE (A)  NONE DETECTED Final   Barbiturates 10/15/2022 NONE DETECTED  NONE DETECTED Final   Comment: (NOTE) DRUG SCREEN FOR MEDICAL PURPOSES ONLY.  IF CONFIRMATION IS NEEDED FOR ANY PURPOSE, NOTIFY LAB WITHIN 5 DAYS.  LOWEST DETECTABLE LIMITS FOR URINE DRUG SCREEN Drug Class                     Cutoff (ng/mL) Amphetamine and metabolites    1000 Barbiturate and metabolites    200 Benzodiazepine                 200 Opiates and metabolites        300 Cocaine and metabolites        300 THC                            50 Performed at [facility], [address] [place], [place] [address]    Glucose-Capillary 10/15/2022 142 (H)  70 - 99 mg/dL Final   Glucose reference range applies only to samples taken after fasting for at least 8 hours.   Preg, Serum 10/15/2022 POSITIVE (A)  NEGATIVE Corrected   Comment:        THE SENSITIVITY OF THIS METHODOLOGY IS >10 mIU/mL. CORRECTED RESULTS CALLED TO: RIVERS, TJ 2352 BY JOHNSON,K  Performed at [facility], [address] [place], [place] [address] CORRECTED ON 08/16 AT 2353: PREVIOUSLY REPORTED AS NEGATIVE        THE SENSITIVITY OF THIS METHODOLOGY IS >10 mIU/mL.    hCG, Beta Chain, Quant, S 10/15/2022 5,225 (H)  <5 mIU/mL Final   Comment:          GEST. AGE      CONC.  (mIU/mL)   <=1 WEEK        5 - 50     2 WEEKS       50 - 500     3 WEEKS       100 - 10,000     4 WEEKS     1,000 - 30,000     5 WEEKS     3,500 - 115,000   6-8 WEEKS     12,000 - 270,000    12 WEEKS     15,000 - 220,000        FEMALE AND NON-PREGNANT FEMALE:     LESS THAN 5 mIU/mL Performed at [facility], [address], [place], [place] [address]    Color, Urine 10/16/2022 YELLOW  YELLOW Final   APPearance 10/16/2022 HAZY (A)  CLEAR Final   Specific Gravity, Urine 10/16/2022 1.032 (H)  1.005 - 1.030 Final   pH 10/16/2022 5.0  5.0 - 8.0 Final   Glucose, UA 10/16/2022 NEGATIVE  NEGATIVE mg/dL Final   Hgb urine dipstick 10/16/2022 NEGATIVE  NEGATIVE Final   Bilirubin Urine 10/16/2022 NEGATIVE  NEGATIVE Final   Ketones, ur 10/16/2022 80 (A)  NEGATIVE mg/dL Final   Protein, ur 69/62/9528 30 (A)  NEGATIVE mg/dL Final   Nitrite 41/32/4401 NEGATIVE  NEGATIVE Final   Leukocytes,Ua 10/16/2022 NEGATIVE  NEGATIVE Final   RBC / HPF 10/16/2022 0-5  0 - 5 RBC/hpf Final   WBC, UA 10/16/2022 0-5  0 - 5 WBC/hpf Final   Bacteria, UA  10/16/2022 NONE SEEN  NONE SEEN Final   Squamous Epithelial / HPF 10/16/2022 11-20  0 - 5 /HPF Final   Mucus 10/16/2022 PRESENT   Final   Performed at [facility], [address] [place], [place] [address]    Allergies: Flax seed oil [flax seed oil], Clonidine derivatives, Red dye #40 (allura red), and Codeine  Medications:  PTA Medications  Medication Sig   ibuprofen (ADVIL) 600 MG tablet Take 1 tablet (600 mg total) by mouth every 6 (six) hours as needed for mild pain or moderate pain. (Patient not taking: Reported on 10/16/2022)    ondansetron (ZOFRAN ODT) 4 MG disintegrating tablet Take 1 tablet (4 mg total) by mouth every 8 (eight) hours as needed for nausea or vomiting. (Patient not taking: Reported on 10/16/2022)   loperamide (IMODIUM) 2 MG capsule Take 1 capsule (2 mg total) by mouth 4 (four) times daily as needed for diarrhea or loose stools. (Patient not taking: Reported on 10/16/2022)   methocarbamol (ROBAXIN) 500 MG tablet Take 1 tablet (500 mg total) by mouth 2 (two) times daily.   ondansetron (ZOFRAN) 4 MG tablet Take 1 tablet (4 mg total) by mouth every 6 (six) hours.      Medical Decision Making  Pt left AMA    Recommendations  Pt refuse to go the [facility] for medical clearance  Sindy Guadeloupe, NP 11/10/22  6:05 AM

## 2022-11-09 NOTE — Progress Notes (Signed)
   11/09/22 2155  [facility] Triage Screening (Walk-ins at [facility] only)  How Did You Hear About [place]? Family/Friend  What Is the Reason for Your Visit/Call Today? Pt presents to [facility] voluntarily, accompanied by her sister requesting substance abuse treatment/detox. Pt is experiecing withdrawals from heroine and last used a few hours ago. Pt is experiencing headache, bodyaches, nausea and sweating. Pt also reports she recently found out that she is pregnant, but uncertain how far along she is. Pt also has history of using cocaine, but has not used in about 3 days. Pt currenlty denies SI,HI,AVH.  How Long Has This Been Causing You Problems? > than 6 months  Have You Recently Had Any Thoughts About Hurting Yourself? No  Are You Planning to Commit Suicide/Harm Yourself At This time? No  Have you Recently Had Thoughts About Hurting Someone Karolee Ohs? No  Are You Planning To Harm Someone At This Time? No  Are you currently experiencing any auditory, visual or other hallucinations? No  Have You Used Any Alcohol or Drugs in the Past 24 Hours? Yes  How long ago did you use Drugs or Alcohol? a few hours ago  What Did You Use and How Much? heroine (unknown quantity)  Do you have any current medical co-morbidities that require immediate attention? No  Clinician description of patient physical appearance/behavior: calm,cooperative, fidgety at times  What Do You Feel Would Help You the Most Today? Alcohol or Drug Use Treatment  If access to [facility] was not available, would you have sought care in the Emergency Department? Yes  Determination of Need Urgent (48 hours)  Options For Referral Other: Comment;Chemical Dependency Intensive Outpatient Therapy (CDIOP);Facility-Based Crisis

## 2022-11-10 ENCOUNTER — Other Ambulatory Visit: Payer: Self-pay

## 2022-11-10 ENCOUNTER — Emergency Department (HOSPITAL_COMMUNITY)
Admission: EM | Admit: 2022-11-10 | Discharge: 2022-11-11 | Discharge status: Against Medical Advice | Payer: 59 | Attending: Emergency Medicine | Admitting: Emergency Medicine

## 2022-11-10 ENCOUNTER — Encounter (HOSPITAL_COMMUNITY): Payer: Self-pay

## 2022-11-10 ENCOUNTER — Ambulatory Visit (HOSPITAL_COMMUNITY)
Admission: EM | Admit: 2022-11-10 | Discharge: 2022-11-10 | Disposition: A | Discharge status: Other Acute Inpatient Hospital | Payer: 59

## 2022-11-10 DIAGNOSIS — F1120 Opioid dependence, uncomplicated: Secondary | ICD-10-CM

## 2022-11-10 DIAGNOSIS — R112 Nausea with vomiting, unspecified: Secondary | ICD-10-CM | POA: Insufficient documentation

## 2022-11-10 DIAGNOSIS — F1910 Other psychoactive substance abuse, uncomplicated: Secondary | ICD-10-CM

## 2022-11-10 DIAGNOSIS — O99321 Drug use complicating pregnancy, first trimester: Secondary | ICD-10-CM | POA: Diagnosis present

## 2022-11-10 DIAGNOSIS — O26891 Other specified pregnancy related conditions, first trimester: Secondary | ICD-10-CM | POA: Diagnosis not present

## 2022-11-10 DIAGNOSIS — F1110 Opioid abuse, uncomplicated: Secondary | ICD-10-CM

## 2022-11-10 DIAGNOSIS — Z3A08 8 weeks gestation of pregnancy: Secondary | ICD-10-CM | POA: Diagnosis not present

## 2022-11-10 DIAGNOSIS — Z5321 Procedure and treatment not carried out due to patient leaving prior to being seen by health care provider: Secondary | ICD-10-CM | POA: Insufficient documentation

## 2022-11-10 DIAGNOSIS — F1493 Cocaine use, unspecified with withdrawal: Secondary | ICD-10-CM

## 2022-11-10 DIAGNOSIS — F19139 Other psychoactive substance abuse with withdrawal, unspecified: Secondary | ICD-10-CM

## 2022-11-10 DIAGNOSIS — F1113 Opioid abuse with withdrawal: Secondary | ICD-10-CM | POA: Diagnosis not present

## 2022-11-10 DIAGNOSIS — F1593 Other stimulant use, unspecified with withdrawal: Secondary | ICD-10-CM | POA: Insufficient documentation

## 2022-11-10 NOTE — Discharge Instructions (Addendum)

## 2022-11-10 NOTE — ED Triage Notes (Addendum)
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 Pt last used @2030 , endorses nausea/vomiting. Pt reports that she is [redacted] weeks pregnant. Pt states that she wants to return to [facility] once clean.

## 2022-11-10 NOTE — Progress Notes (Signed)
   11/10/22 2059  [facility] Triage Screening (Walk-ins at [facility] only)  How Did You Hear About [place]? Family/Friend  What Is the Reason for Your Visit/Call Today? Pt presents to [facility] voluntarily, accompanied by her sister requesting substance abuse treatment/detox. Pt is using heroine and last used about an hour ago. Pt also has history of using cocaine, but has not used in a few days. Pt currently denies SI,HI,AVH.  How Long Has This Been Causing You Problems? > than 6 months  Have You Recently Had Any Thoughts About Hurting Yourself? No  Are You Planning to Commit Suicide/Harm Yourself At This time? No  Have you Recently Had Thoughts About Hurting Someone Karolee Ohs? No  Are You Planning To Harm Someone At This Time? No  Are you currently experiencing any auditory, visual or other hallucinations? No  Have You Used Any Alcohol or Drugs in the Past 24 Hours? Yes  How long ago did you use Drugs or Alcohol? a hour ago  What Did You Use and How Much? heroine (unknown quantity)  Do you have any current medical co-morbidities that require immediate attention? No (pt is pregnant)  Clinician description of patient physical appearance/behavior: Calm, cooperative, oriented  What Do You Feel Would Help You the Most Today? Alcohol or Drug Use Treatment  If access to [facility] was not available, would you have sought care in the Emergency Department? No  Determination of Need Urgent (48 hours)  Options For Referral Other: Comment;Chemical Dependency Intensive Outpatient Therapy (CDIOP);Facility-Based Crisis

## 2022-11-10 NOTE — ED Notes (Signed)
E[facility] was called to transport pt to [place] ed

## 2022-11-10 NOTE — ED Notes (Signed)
Upon entering the building to clock in for work pt was by the employee entrance vomiting ,security was called to explain the security reasons for her not to be by employee entrance pt then proceeded to vomit into the flowers and grass on property NP Channing Mutters and Chinwendu NP was notified

## 2022-11-10 NOTE — ED Notes (Signed)
REPORT GIVEN TO BRITTANY RN [email or web address]  [place]

## 2022-11-11 NOTE — ED Provider Notes (Signed)
Went to evaluate the patient but she was not found in her bed, made attempt to locate the patient but was unsuccessful, patient has eloped.   Carroll Sage, PA-C 11/11/22 0528    Nira Conn, MD 11/11/22 [phone]

## 2022-11-11 NOTE — ED Provider Notes (Signed)
[facility]al [facility] Medical Screening Exam  Patient Name: Laura Callahan MRN: [id number] Date of Evaluation: 11/11/22 Chief Complaint:  " I'm having withdrawals from Heroin".  Diagnosis:  Final diagnoses:  Polysubstance abuse (HCC)  Heroin abuse (HCC)  Opioid use disorder, severe, dependence (HCC)  Other psychoactive substance abuse with withdrawal, unspecified (HCC)    History of Present illness: Laura Callahan is a 22 y.o. female. Psychiatric history of polysubstance abuse, PTSD, Bipolar 1 disorder, MDD and ADHD, who presented voluntarily as a walk in to [facility], accompanied by her sister with complaints of ongoing acute drug withdrawals and requesting substance abuse treatment/detox from Heroin.   Patient was seen face to face by this provider and chart reviewed. Per chart review, patient presented to [facility] 11/09/22 with similar complaint but left AMA because she did not want to be transferred to [facility] due to needing higher level of care..  On evaluation, patient is alert, oriented x 4, and cooperative. Speech is clear, normal rate and coherent. Pt appears disheveled. Eye contact is good. Mood is anxious, affect is congruent with mood. Thought process is goal directed and thought content is WDL. Pt denies SI/HI/AVH. There is no objective indication that the patient is responding to internal stimuli. No delusions elicited during this assessment.    Patient reports " I'm having withdrawals from heroin, I'm having lots of body pain, restless arms and legs, nausea and vomiting, I don't feel well, and I've not being able to have a bowel movement for over a week due to the heroin use, I use about 2 to 3 g of heroin daily and I started using 2 months ago and I'm currently about [redacted] weeks pregnant and I've not started prenatal care".  Patient reports she lives with her mom. Patient denies prior substance abuse rehab stay/treatment.   Support, encouragement, reassurance provided about  ongoing stressors.  Patient provided with opportunity for questions.  Discussed recommendation for transfer to [facility] due to needing higher level of care/treatment, which cannot be safely provided at the [facility] due to her pregnancy, drug use and ongoing withdrawals.  Patient verbalized her understanding and is in agreement.   Flowsheet Row ED from 11/10/2022 in [facility] Emergency Department at [facility] Most recent reading at 11/10/2022 11:39 PM ED from 11/10/2022 in [facility] Most recent reading at 11/10/2022  9:23 PM ED from 11/09/2022 in [facility] Most recent reading at 11/09/2022  9:59 PM  C-SSRS RISK CATEGORY No Risk No Risk No Risk       Psychiatric Specialty Exam  Presentation  General Appearance:Disheveled  Eye Contact:Good  Speech:Clear and Coherent  Speech Volume:Normal  Handedness:Right   Mood and Affect  Mood: Anxious  Affect: Congruent   Thought Process  Thought Processes: Coherent; Goal Directed  Descriptions of Associations:Intact  Orientation:Full (Time, Place and Person)  Thought Content:WDL    Hallucinations:None  Ideas of Reference:None  Suicidal Thoughts:No  Homicidal Thoughts:No   Sensorium  Memory: Immediate Fair  Judgment: Poor  Insight: Shallow   Executive Functions  Concentration: Fair  Attention Span: Fair  Recall: [employer] of Knowledge: Fair  Language: Fair   Psychomotor Activity  Psychomotor Activity: Normal   Assets  Assets: Manufacturing systems engineer; Desire for Improvement   Sleep  Sleep: Poor  Number of hours:  6   Physical Exam: Physical Exam Constitutional:      General: She is in acute distress.     Appearance: She  is not diaphoretic.  HENT:     Nose: No congestion.  Eyes:     General:        Right eye: No discharge.        Left eye: No discharge.  Cardiovascular:     Rate and Rhythm: Bradycardia present.   Pulmonary:     Effort: No respiratory distress.  Chest:     Chest wall: No tenderness.  Neurological:     Mental Status: She is alert and oriented to person, place, and time.  Psychiatric:        Attention and Perception: Attention and perception normal.        Mood and Affect: Mood is anxious. Affect is flat.        Speech: Speech normal.        Behavior: Behavior is cooperative.        Thought Content: Thought content normal. Thought content is not paranoid or delusional. Thought content does not include homicidal or suicidal ideation. Thought content does not include homicidal or suicidal plan.        Cognition and Memory: Cognition and memory normal.    Review of Systems  Constitutional:  Negative for chills, diaphoresis and fever.  HENT:  Negative for congestion.   Eyes:  Negative for discharge.  Respiratory:  Negative for cough, shortness of breath and wheezing.   Cardiovascular:  Negative for chest pain and palpitations.  Gastrointestinal:  Positive for constipation, nausea and vomiting.  Neurological:  Positive for weakness. Negative for tingling and headaches.  Psychiatric/Behavioral:  Positive for substance abuse. The patient is nervous/anxious.    Blood pressure (!) 101/57, pulse (!) 54, temperature 98.5 F (36.9 C), resp. rate 18, SpO2 99%, unknown if currently breastfeeding. There is no height or weight on file to calculate BMI.  Musculoskeletal: Strength & Muscle Tone: within normal limits Gait & Station: normal Patient leans: N/A   [facility] MSE Discharge Disposition for Follow up and Recommendations: Based on my evaluation the patient appears to have an emergency medical condition for which I recommend the patient be transferred to the emergency department for further evaluation.   Patient requires a higher level of care, which cannot be safely provided at the [facility]. Patient states she is about two months pregnant, with daily Heroin use and experiencing acute  withdrawal symptoms.  Recommend transfer to [facility] for medical clearance/higher level of care.  I spoke to Dr Andria Meuse at [facility] and the provider has agreed to accept the patient.   Recommend TTS/Psych consult if indicated, when medically stable.   EMTALA completed.    Mancel Bale, NP 11/11/2022, 3:11 AM

## 2022-11-11 NOTE — ED Notes (Signed)
Unable to locate patient.

## 2022-11-11 NOTE — ED Notes (Signed)
Still unable to locate patient, not in restroom or lobby

## 2022-11-11 NOTE — ED Notes (Signed)
Unable to locate patient, pt not in restroom or in lobby, charge nurse has been made aware.

## 2022-11-21 ENCOUNTER — Inpatient Hospital Stay (HOSPITAL_COMMUNITY): Payer: 59

## 2022-11-21 ENCOUNTER — Inpatient Hospital Stay (HOSPITAL_COMMUNITY)
Admission: AD | Admit: 2022-11-21 | Discharge: 2022-11-21 | Disposition: A | Discharge status: Home / Self Care | Payer: 59 | Attending: Family Medicine | Admitting: Family Medicine

## 2022-11-21 ENCOUNTER — Encounter (HOSPITAL_COMMUNITY): Payer: Self-pay | Admitting: *Deleted

## 2022-11-21 DIAGNOSIS — Z3A10 10 weeks gestation of pregnancy: Secondary | ICD-10-CM | POA: Diagnosis not present

## 2022-11-21 DIAGNOSIS — Z331 Pregnant state, incidental: Secondary | ICD-10-CM | POA: Diagnosis not present

## 2022-11-21 DIAGNOSIS — R1030 Lower abdominal pain, unspecified: Secondary | ICD-10-CM | POA: Diagnosis present

## 2022-11-21 DIAGNOSIS — R112 Nausea with vomiting, unspecified: Secondary | ICD-10-CM | POA: Insufficient documentation

## 2022-11-21 DIAGNOSIS — R1110 Vomiting, unspecified: Secondary | ICD-10-CM | POA: Diagnosis present

## 2022-11-21 LAB — URINALYSIS, ROUTINE W REFLEX MICROSCOPIC
Bacteria, UA: NONE SEEN
Bilirubin Urine: NEGATIVE
Glucose, UA: NEGATIVE mg/dL
Hgb urine dipstick: NEGATIVE
Ketones, ur: 20 mg/dL — AB
Leukocytes,Ua: NEGATIVE
Nitrite: NEGATIVE
Protein, ur: 30 mg/dL — AB
Specific Gravity, Urine: 1.024 (ref 1.005–1.030)
pH: 7 (ref 5.0–8.0)

## 2022-11-21 LAB — COMPREHENSIVE METABOLIC PANEL
ALT: 15 U/L (ref 0–44)
AST: 15 U/L (ref 15–41)
Albumin: 3.5 g/dL (ref 3.5–5.0)
Alkaline Phosphatase: 40 U/L (ref 38–126)
Anion gap: 11 (ref 5–15)
BUN: 5 mg/dL — ABNORMAL LOW (ref 6–20)
CO2: 19 mmol/L — ABNORMAL LOW (ref 22–32)
Calcium: 9 mg/dL (ref 8.9–10.3)
Chloride: 105 mmol/L (ref 98–111)
Creatinine, Ser: 0.43 mg/dL — ABNORMAL LOW (ref 0.44–1.00)
GFR, Estimated: >60 mL/min (ref >60)
Glucose, Bld: 75 mg/dL (ref 70–99)
Potassium: 3.4 mmol/L — ABNORMAL LOW (ref 3.5–5.1)
Sodium: 135 mmol/L (ref 135–145)
Total Bilirubin: 0.9 mg/dL (ref 0.3–1.2)
Total Protein: 6.2 g/dL — ABNORMAL LOW (ref 6.5–8.1)

## 2022-11-21 LAB — CBC
HCT: 38.5 % (ref 36.0–46.0)
Hemoglobin: 12.7 g/dL (ref 12.0–15.0)
MCH: 30.4 pg (ref 26.0–34.0)
MCHC: 33 g/dL (ref 30.0–36.0)
MCV: 92.1 fL (ref 80.0–100.0)
Platelets: 240 10*3/uL (ref 150–400)
RBC: 4.18 MIL/uL (ref 3.87–5.11)
RDW: 13 % (ref 11.5–15.5)
WBC: 6.6 10*3/uL (ref 4.0–10.5)
nRBC: 0 % (ref 0.0–0.2)

## 2022-11-21 LAB — HCG, QUANTITATIVE, PREGNANCY: hCG, Beta Chain, Quant, S: 38637 m[IU]/mL — ABNORMAL HIGH (ref <5)

## 2022-11-21 MED ORDER — SODIUM CHLORIDE 0.9 % IV SOLN
25.0000 mg | Freq: Once | INTRAVENOUS | Status: AC
  Administered 2022-11-21: 25 mg via INTRAVENOUS
  Filled 2022-11-21: qty 1

## 2022-11-21 MED ORDER — LACTATED RINGERS IV BOLUS
1000.0000 mL | Freq: Once | INTRAVENOUS | Status: DC
  Administered 2022-11-21: 1000 mL via INTRAVENOUS

## 2022-11-21 MED ORDER — PROMETHAZINE HCL 25 MG RE SUPP: 25.0000 mg | Freq: Four times a day (QID) | RECTAL | 0 refills | Status: DC | PRN

## 2022-11-21 MED ORDER — SCOPOLAMINE 1 MG/3DAYS TD PT72
1.0000 | MEDICATED_PATCH | TRANSDERMAL | Status: DC
  Administered 2022-11-21: 1.5 mg via TRANSDERMAL
  Filled 2022-11-21: qty 1

## 2022-11-21 MED ORDER — ONDANSETRON 4 MG PO TBDP: 4.0000 mg | ORAL_TABLET | Freq: Three times a day (TID) | ORAL | 0 refills | Status: DC | PRN

## 2022-11-21 MED ORDER — PROCHLORPERAZINE EDISYLATE 10 MG/2ML IJ SOLN
10.0000 mg | INTRAMUSCULAR | Status: AC
  Administered 2022-11-21: 10 mg via INTRAVENOUS
  Filled 2022-11-21: qty 2

## 2022-11-21 NOTE — MAU Note (Signed)
Laura Callahan is a 22 y.o. at Unknown here in MAU reporting: having sharp pains in lower abd. Started this morning, was just kind of an ache off and on and now it has gotten more intense.  No vag bleeding. Has been unable to eat or drink, or keep anything down since yesterday morning.  LMP: sometime in July(+HCG on 8/20) Onset of complaint: yesterday morning Pain score: 7 Vitals:   11/21/22 1826  BP: (!) 107/59  Pulse: 65  Resp: 17  Temp: 98.5 F (36.9 C)  SpO2: 97%      Lab orders placed from triage:  urine

## 2022-11-21 NOTE — MAU Provider Note (Cosign Needed Addendum)
History     CSN: [id number]  Arrival date and time: 11/21/22 1814   Event Date/Time   First Provider Initiated Contact with Patient 11/21/22 1940      No chief complaint on file.  HPI Laura Callahan is a 22 y.o. [id number] at Unknown [place] who presents for pelvic pain and nausea/vomiting. She reports LMP was in July -- unable to recall what part of the month/exact day of LMP. She has not been able to establish with an OB provider quite yet because she is awaiting referral and appt info from her PCP. She has not had an ultrasound yet. She reports lower abdominal pain, which feels like an ache with sharper twinges. She endorses chills, but no fevers, urinary symptoms. No VB.    Past Medical History:  Diagnosis Date   Asthma    Attention deficit    Bipolar 1 disorder (HCC)    Depression    Headache(784.0)    Normal pregnancy in third trimester 08/27/2016   SVD (spontaneous vaginal delivery) 08/27/2016    Past Surgical History:  Procedure Laterality Date   addenoidectomy     TONSILLECTOMY      Family History  Problem Relation Age of Onset   Depression Sister    Depression Mother    Anxiety disorder Mother    Drug abuse Mother    Anxiety disorder Sister    Drug abuse Cousin     Social History   Tobacco Use   Smoking status: Former    Current packs/day: 0.00    Types: Cigarettes    Quit date: 12/02/2017    Years since quitting: 4.9   Smokeless tobacco: Never  Vaping Use   Vaping status: Never Used  Substance Use Topics   Alcohol use: Not Currently   Drug use: Yes    Types: Marijuana    Comment: currently smoking daily to help with nausea    Allergies:  Allergies  Allergen Reactions   Flax Seed Oil [Flax Seed Oil] Swelling    blisters   Clonidine Derivatives Nausea Only    Stomach pain   Red Dye #40 (Allura Red) Nausea And Vomiting   Codeine Nausea And Vomiting and Other (See Comments)    Blisters as a child- tolerates percocet and other pain medications without  issue    Medications Prior to Admission  Medication Sig Dispense Refill Last Dose   ondansetron (ZOFRAN) 4 MG tablet Take 1 tablet (4 mg total) by mouth every 6 (six) hours. 12 tablet 0 Past Month   ibuprofen (ADVIL) 600 MG tablet Take 1 tablet (600 mg total) by mouth every 6 (six) hours as needed for mild pain or moderate pain. (Patient not taking: Reported on 10/16/2022) 30 tablet 0    loperamide (IMODIUM) 2 MG capsule Take 1 capsule (2 mg total) by mouth 4 (four) times daily as needed for diarrhea or loose stools. (Patient not taking: Reported on 10/16/2022) 12 capsule 0    methocarbamol (ROBAXIN) 500 MG tablet Take 1 tablet (500 mg total) by mouth 2 (two) times daily. 20 tablet 0    ondansetron (ZOFRAN ODT) 4 MG disintegrating tablet Take 1 tablet (4 mg total) by mouth every 8 (eight) hours as needed for nausea or vomiting. (Patient not taking: Reported on 10/16/2022) 10 tablet 0    ROS reviewed and pertinent positives and negatives as documented in HPI.  Physical Exam   Blood pressure 110/66, pulse (!) 51, temperature 98.5 F (36.9 C), temperature source Oral, resp. rate  16, height 5\' 5"  (1.651 m), weight 61.3 kg, SpO2 97%, unknown if currently breastfeeding.  Physical Exam Constitutional:      General: She is not in acute distress.    Appearance: Normal appearance.     Comments: pale  HENT:     Head: Normocephalic and atraumatic.     Mouth/Throat:     Mouth: Mucous membranes are dry.  Cardiovascular:     Rate and Rhythm: Normal rate and regular rhythm.     Heart sounds: Normal heart sounds.  Pulmonary:     Effort: Pulmonary effort is normal. No respiratory distress.     Breath sounds: Normal breath sounds.  Abdominal:     General: There is no distension.     Palpations: Abdomen is soft.     Tenderness: There is no abdominal tenderness. There is no right CVA tenderness or left CVA tenderness.  Musculoskeletal:        General: Normal range of motion.  Skin:    General: Skin is  warm and dry.     Findings: No rash.  Neurological:     General: No focal deficit present.     Mental Status: She is alert and oriented to person, place, and time.     MAU Course  Procedures  MDM 22 y.o. [id number] at unknown [place] who presents with complaints of nausea with vomiting. Has been unable to keep anything down since yesterday morning, has NBNB emesis. She also endorses a sharp abdominal ache. Vital signs notable for bradycardia, pt is hypovolemic on exam. No CVAT, benign abdomen. Given abdominal pain and location of pregnancy not confirmed, will proceed with r/o ectopic w/up, provide IV fluids, antiemetics and reassess. Pt is Rh positive per chart review.  9:05 PM  UA with ketonuria, CBC ok. IV fluids infusing.   9:29 PM  Pt back from U/S - per bedside RN requesting something additional for nausea.    Patient signed out to Dorathy Daft, CNM.    Sundra Aland, MD OB Fellow, Faculty Practice [facility], Center for [facility]   Results for orders placed or performed during the hospital encounter of 11/21/22 (from the past 24 hour(s))  Urinalysis, Routine w reflex microscopic -Urine, Clean Catch     Status: Abnormal   Collection Time: 11/21/22  6:45 PM  Result Value Ref Range   Color, Urine AMBER (A) YELLOW   APPearance HAZY (A) CLEAR   Specific Gravity, Urine 1.024 1.005 - 1.030   pH 7.0 5.0 - 8.0   Glucose, UA NEGATIVE NEGATIVE mg/dL   Hgb urine dipstick NEGATIVE NEGATIVE   Bilirubin Urine NEGATIVE NEGATIVE   Ketones, ur 20 (A) NEGATIVE mg/dL   Protein, ur 30 (A) NEGATIVE mg/dL   Nitrite NEGATIVE NEGATIVE   Leukocytes,Ua NEGATIVE NEGATIVE   RBC / HPF 0-5 0 - 5 RBC/hpf   WBC, UA 0-5 0 - 5 WBC/hpf   Bacteria, UA NONE SEEN NONE SEEN   Squamous Epithelial / HPF 6-10 0 - 5 /HPF   Mucus PRESENT   Comprehensive metabolic panel     Status: Abnormal   Collection Time: 11/21/22  8:10 PM  Result Value Ref Range   Sodium 135 135 - 145 mmol/L   Potassium 3.4 (L) 3.5  - 5.1 mmol/L   Chloride 105 98 - 111 mmol/L   CO2 19 (L) 22 - 32 mmol/L   Glucose, Bld 75 70 - 99 mg/dL   BUN 5 (L) 6 - 20 mg/dL   Creatinine, Ser 4.40 (  L) 0.44 - 1.00 mg/dL   Calcium 9.0 8.9 - 21.3 mg/dL   Total Protein 6.2 (L) 6.5 - 8.1 g/dL   Albumin 3.5 3.5 - 5.0 g/dL   AST 15 15 - 41 U/L   ALT 15 0 - 44 U/L   Alkaline Phosphatase 40 38 - 126 U/L   Total Bilirubin 0.9 0.3 - 1.2 mg/dL   GFR, Estimated >08 >65 mL/min   Anion gap 11 5 - 15  CBC     Status: None   Collection Time: 11/21/22  8:10 PM  Result Value Ref Range   WBC 6.6 4.0 - 10.5 K/uL   RBC 4.18 3.87 - 5.11 MIL/uL   Hemoglobin 12.7 12.0 - 15.0 g/dL   HCT 78.4 69.6 - 29.5 %   MCV 92.1 80.0 - 100.0 fL   MCH 30.4 26.0 - 34.0 pg   MCHC 33.0 30.0 - 36.0 g/dL   RDW 28.4 13.2 - 44.0 %   Platelets 240 150 - 400 K/uL   nRBC 0.0 0.0 - 0.2 %  hCG, quantitative, pregnancy     Status: Abnormal   Collection Time: 11/21/22  8:10 PM  Result Value Ref Range   hCG, Beta Chain, Quant, S 38,637 (H) <5 mIU/mL   US OB Comp Less 14 Wks  Result Date: 11/21/2022 CLINICAL DATA:  Nausea and pelvic pain. EXAM: OBSTETRIC <14 WK [place] AND TRANSVAGINAL OB US TECHNIQUE: Both transabdominal and transvaginal ultrasound examinations were performed for complete evaluation of the gestation as well as the maternal uterus, adnexal regions, and pelvic cul-de-sac. Transvaginal technique was performed to assess early pregnancy. COMPARISON:  None Available. FINDINGS: Intrauterine gestational sac: Single Yolk sac:  Visualized. Embryo:  Visualized. Cardiac Activity: Visualized. Heart Rate: 176 bpm CRL:  37.2 mm   10 w   4 d                  [place] EDC: June 15, 2023 Subchorionic hemorrhage:  None visualized. Maternal uterus/adnexae: The bilateral ovaries are visualized and are normal in appearance. No pelvic free fluid is seen. IMPRESSION: Single, viable intrauterine pregnancy at approximately 10 weeks and 4 days gestation by ultrasound evaluation. Electronically  Signed   By: Aram Candela M.D.   On: 11/21/2022 21:40    - [place] results revealed a single living IUP measuring about 105w4d.  - PO Challenge successful,  - plan for discharge  Assessment and Plan   1. Nausea and vomiting, unspecified vomiting type   2. IUP (intrauterine pregnancy), incidental   3. [redacted] weeks gestation of pregnancy    - Reviewed nausea and vomiting as a normal discomfort of pregnancy.  - Discussed Rx options for emesis and comfort measures.  - Rx for Zofran and Phenergan ordered and sent to outpatient pharmacy.  - Recommended that patient seek care at a provider of her choosing.  - Reviewed worsening signs and return precautions.  - Patient discharged home in stable condition and may return to MAU as needed.   Brooklyn Alfredo Danella Deis) Suzie Portela, MSN, CNM  Center for [facility]  11/21/2022 11:33 PM

## 2023-02-14 ENCOUNTER — Encounter: Admitting: Certified Nurse Midwife

## 2023-02-17 ENCOUNTER — Other Ambulatory Visit: Payer: Self-pay

## 2023-02-17 ENCOUNTER — Inpatient Hospital Stay (HOSPITAL_COMMUNITY)
Admission: AD | Admit: 2023-02-17 | Discharge: 2023-02-17 | Disposition: A | Discharge status: Home / Self Care | Payer: 59 | Attending: Obstetrics & Gynecology | Admitting: Obstetrics & Gynecology

## 2023-02-17 ENCOUNTER — Encounter (HOSPITAL_COMMUNITY): Payer: Self-pay | Admitting: Obstetrics & Gynecology

## 2023-02-17 DIAGNOSIS — B349 Viral infection, unspecified: Secondary | ICD-10-CM | POA: Insufficient documentation

## 2023-02-17 DIAGNOSIS — R112 Nausea with vomiting, unspecified: Secondary | ICD-10-CM | POA: Insufficient documentation

## 2023-02-17 DIAGNOSIS — J069 Acute upper respiratory infection, unspecified: Secondary | ICD-10-CM | POA: Diagnosis not present

## 2023-02-17 LAB — URINALYSIS, ROUTINE W REFLEX MICROSCOPIC
Bilirubin Urine: NEGATIVE
Glucose, UA: NEGATIVE mg/dL
Hgb urine dipstick: NEGATIVE
Ketones, ur: 80 mg/dL — AB
Nitrite: NEGATIVE
Protein, ur: 30 mg/dL — AB
Specific Gravity, Urine: 1.028 (ref 1.005–1.030)
pH: 5 (ref 5.0–8.0)

## 2023-02-17 LAB — RESP PANEL BY RT-PCR (RSV, FLU A&B, COVID)  RVPGX2
Influenza A by PCR: NEGATIVE
Influenza B by PCR: NEGATIVE
Resp Syncytial Virus by PCR: NEGATIVE
SARS Coronavirus 2 by RT PCR: NEGATIVE

## 2023-02-17 MED ORDER — ONDANSETRON 4 MG PO TBDP: 4.0000 mg | ORAL_TABLET | Freq: Three times a day (TID) | ORAL | 0 refills | Status: DC | PRN

## 2023-02-17 MED ORDER — ONDANSETRON 4 MG PO TBDP
4.0000 mg | ORAL_TABLET | Freq: Once | ORAL | Status: AC
  Administered 2023-02-17: 4 mg via ORAL
  Filled 2023-02-17: qty 1

## 2023-02-17 NOTE — MAU Provider Note (Signed)
History     CSN: [id number]  Arrival date and time: 02/17/23 0920   None     Chief Complaint  Patient presents with   Sore Throat   Abdominal Pain   Cough   Nausea   Emesis   Patient presenting for evaluation for cough, congestion, rhinorrhea, body aches, fever, nausea, vomiting, constipation.  Reports that all of the symptoms started day before yesterday.  She reports that she has been having difficulty keeping liquids down but has been sipping on ice and Sprite which she is able to tolerate.  She has been taking Tylenol for the fever and bodyaches which are helping.  Reports that her child recently tested positive for the flu.  Denies any vaginal bleeding, gush of fluid.  Reports good fetal movement.  Is not taking anything for the nausea at this time.    OB History     Gravida  5   Para  2   Term  1   Preterm  1   AB  2   Living  2      SAB  2   IAB  0   Ectopic  0   Multiple  0   Live Births  2           Past Medical History:  Diagnosis Date   Asthma    Attention deficit    Bipolar 1 disorder (HCC)    Depression    Headache(784.0)    Normal pregnancy in third trimester 08/27/2016   SVD (spontaneous vaginal delivery) 08/27/2016    Past Surgical History:  Procedure Laterality Date   addenoidectomy     TONSILLECTOMY      Family History  Problem Relation Age of Onset   Depression Sister    Depression Mother    Anxiety disorder Mother    Drug abuse Mother    Anxiety disorder Sister    Drug abuse Cousin     Social History   Tobacco Use   Smoking status: Former    Current packs/day: 0.00    Types: Cigarettes    Quit date: 12/02/2017    Years since quitting: 5.2   Smokeless tobacco: Never  Vaping Use   Vaping status: Never Used  Substance Use Topics   Alcohol use: Not Currently   Drug use: Not Currently    Types: Marijuana    Comment: currently smoking daily to help with nausea    Allergies:  Allergies  Allergen Reactions    Flax Seed Oil [Flax Seed Oil] Swelling    blisters   Clonidine Derivatives Nausea Only    Stomach pain   Red Dye #40 (Allura Red) Nausea And Vomiting   Codeine Nausea And Vomiting and Other (See Comments)    Blisters as a child- tolerates percocet and other pain medications without issue    Medications Prior to Admission  Medication Sig Dispense Refill Last Dose/Taking   buprenorphine-naloxone (SUBOXONE) 8-2 mg SUBL SL tablet Place under the tongue.   Past Week   Prenatal Vit-Fe Fumarate-FA (PRENATAL MULTIVITAMIN) TABS tablet Take 1 tablet by mouth daily at 12 noon.   Past Week   ibuprofen (ADVIL) 600 MG tablet Take 1 tablet (600 mg total) by mouth every 6 (six) hours as needed for mild pain or moderate pain. (Patient not taking: Reported on 10/16/2022) 30 tablet 0    loperamide (IMODIUM) 2 MG capsule Take 1 capsule (2 mg total) by mouth 4 (four) times daily as needed for diarrhea or loose  stools. (Patient not taking: Reported on 10/16/2022) 12 capsule 0    methocarbamol (ROBAXIN) 500 MG tablet Take 1 tablet (500 mg total) by mouth 2 (two) times daily. 20 tablet 0    ondansetron (ZOFRAN ODT) 4 MG disintegrating tablet Take 1 tablet (4 mg total) by mouth every 8 (eight) hours as needed for nausea or vomiting. (Patient not taking: Reported on 10/16/2022) 10 tablet 0    ondansetron (ZOFRAN) 4 MG tablet Take 1 tablet (4 mg total) by mouth every 6 (six) hours. 12 tablet 0    ondansetron (ZOFRAN-ODT) 4 MG disintegrating tablet Take 1 tablet (4 mg total) by mouth every 8 (eight) hours as needed for nausea or vomiting. 15 tablet 0    promethazine (PHENERGAN) 25 MG suppository Place 1 suppository (25 mg total) rectally every 6 (six) hours as needed for nausea or vomiting. 12 each 0     Review of Systems  Constitutional:  Positive for chills and fever.  HENT:  Positive for congestion and rhinorrhea.   Respiratory:  Negative for shortness of breath.   Cardiovascular:  Negative for chest pain.   Gastrointestinal:  Positive for abdominal pain, constipation, nausea and vomiting. Negative for diarrhea.  Endocrine: Negative for polyuria.  Genitourinary:  Negative for dysuria, vaginal bleeding and vaginal discharge.  Neurological:  Positive for headaches.   Physical Exam   Blood pressure 115/69, pulse 73, temperature (!) 97.4 F (36.3 C), temperature source Oral, resp. rate 19, height 5\' 5"  (1.651 m), weight 64 kg, SpO2 100%, unknown if currently breastfeeding.  Physical Exam Vitals and nursing note reviewed.  Constitutional:      Appearance: She is well-developed. She is ill-appearing.  HENT:     Head: Normocephalic and atraumatic.  Eyes:     Extraocular Movements: Extraocular movements intact.  Cardiovascular:     Rate and Rhythm: Normal rate.  Pulmonary:     Effort: Pulmonary effort is normal. No respiratory distress.     Breath sounds: No wheezing, rhonchi or rales.  Abdominal:     General: Abdomen is flat.     Palpations: Abdomen is soft.     Tenderness: There is no abdominal tenderness.     Hernia: No hernia is present.  Skin:    General: Skin is warm.  Neurological:     Mental Status: She is alert.     MAU Course  Procedures  MDM Flu test RSV test COVID test Physical exam NST  Assessment and Plan  Laura Callahan is a 22 yo [id number] presenting for viral illness symptoms.  Viral illness Signs and symptoms consistent with viral URI.  Flu test negative.  COVID test negative.  Likely some other viral URI.  Discussed symptomatic management.  Provided list of safe medications to take during pregnancy.  Did give prescription for Zofran to help with patient's nausea.  Discussed tricked return precautions.  Patient discharged home.  Laura Callahan 02/17/2023, 10:18 AM

## 2023-02-17 NOTE — MAU Note (Signed)
RN called lab to inquire about  Resp panel and Urinalysis. Lab tech, Harriett Sine, answered and informed RN that they have received sample and will process now.Marland Kitchen

## 2023-02-17 NOTE — Discharge Instructions (Signed)
It was wonderful taking care of you today!  I am sorry you feel so bad.  Please continue to take Tylenol as needed for pain or fever.  Below is a list of medications that are safe to take during pregnancy for a viral upper respiratory infection.  We tested you for COVID, flu, RSV and all those test came back negative.  It is likely another viral upper respiratory infection and will resolve on its own.  The management is all symptomatic care.  If you have any questions, your symptoms worsen or you have any concerns please return for further evaluation.  I hope you have a great day!  Safe Medications in Pregnancy   Acne:  Benzoyl Peroxide  Salicylic Acid   Backache/Headache:  Tylenol: 2 regular strength every 4 hours OR               2 Extra strength every 6 hours   Colds/Coughs/Allergies:  Benadryl (alcohol free) 25 mg every 6 hours as needed  Breath right strips  Claritin  Cepacol throat lozenges  Chloraseptic throat spray  Cold-Eeze- up to three times per day  Cough drops, alcohol free  Flonase (by prescription only)  Guaifenesin  Mucinex  Robitussin DM (plain only, alcohol free)  Saline nasal spray/drops  Sudafed (pseudoephedrine) & Actifed * use only after [redacted] weeks gestation and if you do not have high blood pressure  Tylenol  Vicks Vaporub  Zinc lozenges  Zyrtec   Constipation:  Colace  Ducolax suppositories  Fleet enema  Glycerin suppositories  Metamucil  Milk of magnesia  Miralax  Senokot  Smooth move tea   Diarrhea:  Kaopectate  Imodium A-D   *NO pepto Bismol   Hemorrhoids:  Anusol  Anusol HC  Preparation H  Tucks   Indigestion:  Tums  Maalox  Mylanta  Zantac  Pepcid   Insomnia:  Benadryl (alcohol free) 25mg  every 6 hours as needed  Tylenol PM  Unisom, no Gelcaps   Leg Cramps:  Tums  MagGel   Nausea/Vomiting:  Bonine  Dramamine  Emetrol  Ginger extract  Sea bands  Meclizine  Nausea medication to take during pregnancy:  Unisom  (doxylamine succinate 25 mg tablets) Take one tablet daily at bedtime. If symptoms are not adequately controlled, the dose can be increased to a maximum recommended dose of two tablets daily (1/2 tablet in the morning, 1/2 tablet mid-afternoon and one at bedtime).  Vitamin B6 100mg  tablets. Take one tablet twice a day (up to 200 mg per day).   Skin Rashes:  Aveeno products  Benadryl cream or 25mg  every 6 hours as needed  Calamine Lotion  1% cortisone cream   Yeast infection:  Gyne-lotrimin 7  Monistat 7    **If taking multiple medications, please check labels to avoid duplicating the same active ingredients  **take medication as directed on the label  ** Do not exceed 4000 mg of tylenol in 24 hours  **Do not take medications that contain aspirin or ibuprofen

## 2023-02-17 NOTE — MAU Note (Signed)
Laura Callahan is a 22 y.o. at [redacted]w[redacted]d here in MAU reporting: she has a sore throat, cough, N/V, and lower abdominal cramping that began yesterday early morning.  Reports has taken Tylenol & cough drop, but unable to keep anything down.  States has vomited 10x in past 24 hours. Denies VB or LOF.  Endorses +FM. LMP: NA Onset of complaint: yesterday Pain score: 6 Vitals:   02/17/23 0957  BP: 115/69  Pulse: 73  Resp: 19  Temp: (!) 97.4 F (36.3 C)  SpO2: 100%     FHT:152 bpm Lab orders placed from triage:   UA

## 2023-03-02 NOTE — L&D Delivery Note (Addendum)
 Delivery Note Laura Callahan is a 23 y.o. [id number] at [redacted]w[redacted]d admitted for IOL due to FGR.   GBS Status:  Culture results pending Maximum Maternal Temperature: 101.9 F  Labor course:  Augmentation with: AROM, Pitocin, Cytotec, and IP Foley. She then progressed to complete.  ROM: 27h 83m with Clear fluid  Birth: At [phone] a viable female was delivered via spontaneous vaginal delivery (Presentation: OA ). Nuchal cord present: No.  Shoulders and body delivered in usual fashion. Infant placed directly on mom's abdomen for bonding/skin-to-skin, baby dried and stimulated. Cord clamped x 2 after 1 minute and cut by aunt of mother.  Cord blood collected.  The placenta separated spontaneously and delivered via gentle cord traction.  Pitocin infused rapidly IV per protocol. TXA was given. Fundus firm with massage.  Placenta inspected and appears to be intact with a 3 VC.  Placenta/Cord with the following complications: none .  Cord pH: pending Sponge and instrument count were correct x2.  Intrapartum complications:  Chorio, IUGR, and Non-reassuring Fetal Status Anesthesia:  epidural Episiotomy: none Lacerations:  none Suture Repair:  na EBL (mL): 100 mL   Infant: APGAR (1 MIN):  8 APGAR (5 MINS):   APGAR (10 MINS):   7 Infant weight: pending  Mom to postpartum.  Baby to Couplet care / Skin to Skin. Placenta to Pathology for chorioamnionitis     Plans to Breastfeed Contraception:  undecided  Circumcision: wants inpatient  Note sent to [facility]: MCW for pp visit.  Hewitt Shorts, MPH Family Medicine PGY-1 05/27/2023 6:48 AM   The above was performed under my direct supervision and guidance.

## 2023-03-03 ENCOUNTER — Ambulatory Visit (HOSPITAL_COMMUNITY): Admission: EM | Admit: 2023-03-03 | Discharge: 2023-03-03 | Discharge status: Against Medical Advice | Payer: 59

## 2023-03-03 NOTE — Progress Notes (Addendum)
   03/03/23 1535  [facility] Triage Screening (Walk-ins at [facility] only)  How Did You Hear About Us ? Self  What Is the Reason for Your Visit/Call Today? Laura Callahan presents to [facility] voluntarily unaccompanied. Pt states that she wants to detox from fentanyl . Pt denies SI, HI, AVH and alcohol use at this time. Pt admits to using a half gram of fentanyl  this morning.  How Long Has This Been Causing You Problems? > than 6 months  Have You Recently Had Any Thoughts About Hurting Yourself? No  Are You Planning to Commit Suicide/Harm Yourself At This time? No  Have you Recently Had Thoughts About Hurting Someone [name]? No  Are You Planning To Harm Someone At This Time? No  Physical Abuse Denies  Verbal Abuse Denies  Sexual Abuse Denies  Exploitation of patient/patient's resources Denies  Self-Neglect Denies  Are you currently experiencing any auditory, visual or other hallucinations? No  Have You Used Any Alcohol or Drugs in the Past 24 Hours? Yes  How long ago did you use Drugs or Alcohol? this morning  What Did You Use and How Much? fentanyl  - a half gram  Do you have any current medical co-morbidities that require immediate attention? Yes  Please describe current medical co-morbidities that require immediate attention: pregnant  Clinician description of patient physical appearance/behavior: cooperative, swaying side to side, nodding off  What Do You Feel Would Help You the Most Today? Social Support;Alcohol or Drug Use Treatment  If access to [facility] Urgent Care was not available, would you have sought care in the Emergency Department? No  Determination of Need Routine (7 days)  Options For Referral Medication Management;Inpatient Hospitalization;Facility-Based Crisis;Outpatient Therapy;Chemical Dependency Intensive Outpatient Therapy (CDIOP)  Determination of Need filed? Yes

## 2023-03-06 ENCOUNTER — Inpatient Hospital Stay (HOSPITAL_COMMUNITY)
Admission: RE | Admit: 2023-03-06 | Discharge: 2023-03-10 | DRG: 832 | Disposition: A | Discharge status: Home / Self Care | Payer: 59 | Attending: Obstetrics & Gynecology | Admitting: Obstetrics & Gynecology

## 2023-03-06 ENCOUNTER — Ambulatory Visit (HOSPITAL_COMMUNITY)
Admission: EM | Admit: 2023-03-06 | Discharge: 2023-03-06 | Disposition: A | Discharge status: Other Acute Inpatient Hospital | Payer: 59

## 2023-03-06 ENCOUNTER — Other Ambulatory Visit: Payer: Self-pay

## 2023-03-06 ENCOUNTER — Encounter (HOSPITAL_COMMUNITY): Payer: Self-pay | Admitting: Obstetrics & Gynecology

## 2023-03-06 ENCOUNTER — Encounter (HOSPITAL_COMMUNITY): Payer: Self-pay | Admitting: Family Medicine

## 2023-03-06 DIAGNOSIS — F1120 Opioid dependence, uncomplicated: Secondary | ICD-10-CM | POA: Diagnosis present

## 2023-03-06 DIAGNOSIS — F1193 Opioid use, unspecified with withdrawal: Secondary | ICD-10-CM | POA: Diagnosis not present

## 2023-03-06 DIAGNOSIS — O99019 Anemia complicating pregnancy, unspecified trimester: Secondary | ICD-10-CM | POA: Diagnosis present

## 2023-03-06 DIAGNOSIS — Z3A25 25 weeks gestation of pregnancy: Secondary | ICD-10-CM | POA: Diagnosis not present

## 2023-03-06 DIAGNOSIS — O99322 Drug use complicating pregnancy, second trimester: Secondary | ICD-10-CM | POA: Diagnosis present

## 2023-03-06 DIAGNOSIS — O99012 Anemia complicating pregnancy, second trimester: Secondary | ICD-10-CM | POA: Diagnosis present

## 2023-03-06 DIAGNOSIS — G2581 Restless legs syndrome: Secondary | ICD-10-CM | POA: Diagnosis present

## 2023-03-06 DIAGNOSIS — F319 Bipolar disorder, unspecified: Secondary | ICD-10-CM | POA: Diagnosis present

## 2023-03-06 DIAGNOSIS — O219 Vomiting of pregnancy, unspecified: Secondary | ICD-10-CM | POA: Diagnosis not present

## 2023-03-06 DIAGNOSIS — O99342 Other mental disorders complicating pregnancy, second trimester: Secondary | ICD-10-CM | POA: Diagnosis present

## 2023-03-06 DIAGNOSIS — Z818 Family history of other mental and behavioral disorders: Secondary | ICD-10-CM | POA: Diagnosis not present

## 2023-03-06 DIAGNOSIS — F1123 Opioid dependence with withdrawal: Secondary | ICD-10-CM | POA: Diagnosis present

## 2023-03-06 DIAGNOSIS — M79603 Pain in arm, unspecified: Secondary | ICD-10-CM | POA: Diagnosis present

## 2023-03-06 DIAGNOSIS — Z3A26 26 weeks gestation of pregnancy: Secondary | ICD-10-CM | POA: Diagnosis not present

## 2023-03-06 DIAGNOSIS — O26892 Other specified pregnancy related conditions, second trimester: Secondary | ICD-10-CM | POA: Diagnosis not present

## 2023-03-06 DIAGNOSIS — Z813 Family history of other psychoactive substance abuse and dependence: Secondary | ICD-10-CM

## 2023-03-06 DIAGNOSIS — Z87891 Personal history of nicotine dependence: Secondary | ICD-10-CM

## 2023-03-06 DIAGNOSIS — O0990 Supervision of high risk pregnancy, unspecified, unspecified trimester: Secondary | ICD-10-CM | POA: Diagnosis not present

## 2023-03-06 DIAGNOSIS — F1190 Opioid use, unspecified, uncomplicated: Secondary | ICD-10-CM

## 2023-03-06 DIAGNOSIS — O99612 Diseases of the digestive system complicating pregnancy, second trimester: Secondary | ICD-10-CM | POA: Diagnosis not present

## 2023-03-06 LAB — CBC
HCT: 31.2 % — ABNORMAL LOW (ref 36.0–46.0)
Hemoglobin: 10.1 g/dL — ABNORMAL LOW (ref 12.0–15.0)
MCH: 30.7 pg (ref 26.0–34.0)
MCHC: 32.4 g/dL (ref 30.0–36.0)
MCV: 94.8 fL (ref 80.0–100.0)
Platelets: 259 10*3/uL (ref 150–400)
RBC: 3.29 MIL/uL — ABNORMAL LOW (ref 3.87–5.11)
RDW: 13.9 % (ref 11.5–15.5)
WBC: 6.7 10*3/uL (ref 4.0–10.5)
nRBC: 0 % (ref 0.0–0.2)

## 2023-03-06 LAB — DIFFERENTIAL
Abs Immature Granulocytes: 0.02 10*3/uL (ref 0.00–0.07)
Basophils Absolute: 0 10*3/uL (ref 0.0–0.1)
Basophils Relative: 1 %
Eosinophils Absolute: 0.2 10*3/uL (ref 0.0–0.5)
Eosinophils Relative: 3 %
Immature Granulocytes: 0 %
Lymphocytes Relative: 30 %
Lymphs Abs: 2 10*3/uL (ref 0.7–4.0)
Monocytes Absolute: 0.4 10*3/uL (ref 0.1–1.0)
Monocytes Relative: 5 %
Neutro Abs: 4.1 10*3/uL (ref 1.7–7.7)
Neutrophils Relative %: 61 %

## 2023-03-06 LAB — TYPE AND SCREEN
ABO/RH(D): A POS
Antibody Screen: NEGATIVE

## 2023-03-06 LAB — HIV ANTIBODY (ROUTINE TESTING W REFLEX): HIV Screen 4th Generation wRfx: NONREACTIVE

## 2023-03-06 LAB — HEPATITIS B SURFACE ANTIGEN: Hepatitis B Surface Ag: NONREACTIVE

## 2023-03-06 MED ORDER — CALCIUM CARBONATE ANTACID 500 MG PO CHEW
2.0000 | CHEWABLE_TABLET | ORAL | Status: DC | PRN
  Administered 2023-03-06 – 2023-03-10 (×2): 400 mg via ORAL
  Filled 2023-03-06 (×2): qty 2

## 2023-03-06 MED ORDER — HYDROXYZINE HCL 50 MG PO TABS
25.0000 mg | ORAL_TABLET | Freq: Three times a day (TID) | ORAL | Status: DC | PRN
  Administered 2023-03-07 – 2023-03-08 (×2): 25 mg via ORAL
  Filled 2023-03-06 (×2): qty 1

## 2023-03-06 MED ORDER — LACTATED RINGERS IV SOLN: 125.0000 mL/h | INTRAVENOUS | Status: AC

## 2023-03-06 MED ORDER — SODIUM CHLORIDE 0.9 % IV SOLN: 250.0000 mL | INTRAVENOUS | Status: AC | PRN

## 2023-03-06 MED ORDER — SODIUM CHLORIDE 0.9% FLUSH
3.0000 mL | Freq: Two times a day (BID) | INTRAVENOUS | Status: DC
  Administered 2023-03-06 – 2023-03-09 (×5): 3 mL via INTRAVENOUS

## 2023-03-06 MED ORDER — PANTOPRAZOLE SODIUM 40 MG IV SOLR
40.0000 mg | Freq: Once | INTRAVENOUS | Status: AC
  Administered 2023-03-07: 40 mg via INTRAVENOUS
  Filled 2023-03-06: qty 10

## 2023-03-06 MED ORDER — SODIUM CHLORIDE 0.9% FLUSH
3.0000 mL | INTRAVENOUS | Status: DC | PRN
Start: 2023-03-06 — End: 2023-03-10

## 2023-03-06 MED ORDER — DICYCLOMINE HCL 20 MG PO TABS
20.0000 mg | ORAL_TABLET | Freq: Three times a day (TID) | ORAL | Status: DC
  Administered 2023-03-07 – 2023-03-10 (×8): 20 mg via ORAL
  Filled 2023-03-06 (×13): qty 1

## 2023-03-06 MED ORDER — CLONIDINE HCL 0.1 MG PO TABS
0.1000 mg | ORAL_TABLET | Freq: Three times a day (TID) | ORAL | Status: DC | PRN
  Administered 2023-03-08 (×2): 0.1 mg via ORAL
  Filled 2023-03-06 (×4): qty 1

## 2023-03-06 MED ORDER — LOPERAMIDE HCL 2 MG PO CAPS
2.0000 mg | ORAL_CAPSULE | ORAL | Status: DC | PRN
  Administered 2023-03-07: 2 mg via ORAL
  Filled 2023-03-06: qty 1

## 2023-03-06 MED ORDER — DOCUSATE SODIUM 100 MG PO CAPS
100.0000 mg | ORAL_CAPSULE | Freq: Every day | ORAL | Status: DC
  Filled 2023-03-06: qty 1

## 2023-03-06 MED ORDER — ZOLPIDEM TARTRATE 5 MG PO TABS
5.0000 mg | ORAL_TABLET | Freq: Every evening | ORAL | Status: DC | PRN
  Administered 2023-03-08: 5 mg via ORAL
  Filled 2023-03-06: qty 1

## 2023-03-06 MED ORDER — ACETAMINOPHEN 325 MG PO TABS
650.0000 mg | ORAL_TABLET | ORAL | Status: DC | PRN
  Administered 2023-03-06 – 2023-03-07 (×2): 650 mg via ORAL
  Filled 2023-03-06 (×2): qty 2

## 2023-03-06 MED ORDER — PRENATAL MULTIVITAMIN CH
1.0000 | ORAL_TABLET | Freq: Every day | ORAL | Status: DC
  Administered 2023-03-07 – 2023-03-10 (×4): 1 via ORAL
  Filled 2023-03-06 (×4): qty 1

## 2023-03-06 MED ORDER — ONDANSETRON HCL 4 MG/2ML IJ SOLN
4.0000 mg | Freq: Four times a day (QID) | INTRAMUSCULAR | Status: DC | PRN
  Administered 2023-03-08: 4 mg via INTRAVENOUS
  Filled 2023-03-06: qty 2

## 2023-03-06 NOTE — ED Notes (Signed)
 Patient transferred via EMS to [facility] unit room 105. Respirations equal and unlabored, skin warm and dry. No acute distress noted.

## 2023-03-06 NOTE — Progress Notes (Signed)
   03/06/23 1820  [facility] Triage Screening (Walk-ins at [facility] only)  How Did You Hear About Us ? Self  What Is the Reason for Your Visit/Call Today? Laura Callahan voluntarily presented to the [facility] Urgent [facility]) unaccompanied, seeking help for her substance use and mental health concerns. She has a history of Bipolar Disorder, Depression, and Anxiety. Over the past 30 days, she has been using fentanyl  daily, consuming 1-2 grams per day. Before this, she was sober for 60 days, following a six-month period of fentanyl  use. Her last reported use was a few hours before her arrival. In addition, she has used cocaine  and THC 1-2 times per week, with her most recent cocaine  use occurring 1-2 weeks ago and THC use reported as yesterday. She denies experiencing withdrawal symptoms. Her substance use treatment history includes noncompliance with Medication-Assisted Treatment (MAT) using Suboxone , which she last took in early December 2024. She denies alcohol use.  The patient denies suicidal ideation, self-injurious behaviors, and homicidal ideations, as well as auditory or visual hallucinations, paranoia, or delusional thinking. She has a history of four inpatient psychiatric hospitalizations, with the most recent in 2016. She is currently six months pregnant, with her next prenatal appointment scheduled for March 09, 2022. She has two children, ages 14 and 70, and resides with her aunt. She is unemployed and has no current therapist or psychiatrist.  Sacoya also reports having a court date tomorrow for a probation violation. While she denies being an imminent danger to herself or others, she is seeking detoxification and stabilization.  How Long Has This Been Causing You Problems? > than 6 months  Have You Recently Had Any Thoughts About Hurting Yourself? No  Are You Planning to Commit Suicide/Harm Yourself At This time? No  Have you Recently Had Thoughts About Hurting Someone [name]? No  Are You Planning  To Harm Someone At This Time? No  Physical Abuse Denies  Verbal Abuse Denies  Sexual Abuse Denies  Exploitation of patient/patient's resources Denies  Self-Neglect Denies  Are you currently experiencing any auditory, visual or other hallucinations? No  Have You Used Any Alcohol or Drugs in the Past 24 Hours? Yes  How long ago did you use Drugs or Alcohol? This morning she last used fantanyl. The THC she last used yesterday. The cocaine  she last used 1-2 weeks ago.  What Did You Use and How Much? Fentany (1-2 grams)  Do you have any current medical co-morbidities that require immediate attention? Yes  Please describe current medical co-morbidities that require immediate attention: 6 months pregnant  Clinician description of patient physical appearance/behavior: Cooperative. Appears relaxed. Friendly, casually dressed.  Determination of Need Routine (7 days)  Options For Referral Chemical Dependency Intensive Outpatient Therapy (CDIOP);Facility-Based Crisis;Medication Management;Outpatient Therapy

## 2023-03-06 NOTE — ED Notes (Signed)
 Attempted to give report x 2. Staff stated they would call me back.

## 2023-03-06 NOTE — H&P (Addendum)
 Laura Callahan is an 23 y.o. [phone] female.   Chief Complaint: Opiate use disorder HPI: patient is currently [redacted] weeks pregnant and snorts 1-2 gms of fentanyl  daily. Has tried Suboxone  in the past, and wants to do this. She is going to [facility] clinic and is scheduled for first visit on 03/10/2023. She last used a few hours ago.   Past Medical History:  Diagnosis Date   Asthma    Attention deficit    Bipolar 1 disorder (HCC)    Depression    Headache(784.0)    Normal pregnancy in third trimester 08/27/2016   SVD (spontaneous vaginal delivery) 08/27/2016    Past Surgical History:  Procedure Laterality Date   addenoidectomy     TONSILLECTOMY      Family History  Problem Relation Age of Onset   Depression Sister    Depression Mother    Anxiety disorder Mother    Drug abuse Mother    Anxiety disorder Sister    Drug abuse Cousin    Social History:  reports that she quit smoking about 5 years ago. Her smoking use included cigarettes. She has never used smokeless tobacco. She reports that she does not currently use alcohol. She reports current drug use. Drugs: Marijuana, Fentanyl , Methamphetamines, and Cocaine .  Allergies:  Allergies  Allergen Reactions   Flax Seed Oil [Flax Seed Oil] Swelling    blisters   Clonidine  Derivatives Nausea Only    Stomach pain   Red Dye #40 (Allura Red) Nausea And Vomiting   Codeine Nausea And Vomiting and Other (See Comments)    Blisters as a child- tolerates percocet and other pain medications without issue    Medications Prior to Admission  Medication Sig Dispense Refill   buprenorphine -naloxone  (SUBOXONE ) 8-2 mg SUBL SL tablet Place under the tongue.     ibuprofen  (ADVIL ) 600 MG tablet Take 1 tablet (600 mg total) by mouth every 6 (six) hours as needed for mild pain or moderate pain. (Patient not taking: Reported on 10/16/2022) 30 tablet 0   loperamide  (IMODIUM ) 2 MG capsule Take 1 capsule (2 mg total) by mouth 4 (four) times daily as needed for  diarrhea or loose stools. (Patient not taking: Reported on 10/16/2022) 12 capsule 0   Prenatal Vit-Fe Fumarate-FA (PRENATAL MULTIVITAMIN) TABS tablet Take 1 tablet by mouth daily at 12 noon.     promethazine  (PHENERGAN ) 25 MG suppository Place 1 suppository (25 mg total) rectally every 6 (six) hours as needed for nausea or vomiting. 12 each 0    A comprehensive review of systems was negative.  unknown if currently breastfeeding.  General appearance: alert, cooperative, and appears stated age Head: Normocephalic, without obvious abnormality, atraumatic Neck: supple, symmetrical, trachea midline Lungs:  normal effort Heart: regular rate and rhythm Abdomen: soft, non-tender; bowel sounds normal; no masses,  no organomegaly Extremities: extremities normal, atraumatic, no cyanosis or edema Skin: Skin color, texture, turgor normal. No rashes or lesions Neurologic: Grossly normal  NST:  Baseline: 135 bpm, Variability: Good {> 6 bpm), Accelerations: Reactive, and Decelerations: Absent   Assessment/Plan Principal Problem:   Opiate withdrawal (HCC) Active Problems:   Bipolar I disorder (HCC)   [redacted] weeks gestation of pregnancy  Admit COWS scores Clonidine  Bentyl   Imodium  Zofran  Vistaril   She will be seen by Dr. Lola or Barbra in the am for Suboxone  dosing.   Laura Callahan 03/06/2023, 10:10 PM

## 2023-03-06 NOTE — ED Notes (Signed)
 Non-Emergent contacted for transport to [facility].

## 2023-03-06 NOTE — Discharge Instructions (Signed)
 Transfer patient to [facility] MAU for fentanyl detox.  Per Dr. Vergie Living patient can be admitted directly to the [facility] specialty care unit.  Patient will be transferred via non-EMS.

## 2023-03-06 NOTE — ED Provider Notes (Signed)
 Behavioral Health Urgent Care Medical Screening Exam  Patient Name: Laura Callahan MRN: [id number] Date of Evaluation: 03/06/23 Chief Complaint:  Requesting fentanyl  detox Diagnosis:  Final diagnoses:  Opioid use disorder    History of Present illness: Laura Callahan is a 23 y.o. female patient presented to [facility] as a walk in  unaccompanied with complaints of requesting fentanyl  detox  Laura Callahan, 22 y.o., female patient seen face to face by this provider, consulted with Dr. Larina; and chart reviewed on 03/06/23.  Patient reports a past psychiatric history that includes bipolar disorder, depression, anxiety, and polysubstance use (marijuana, cocaine , fentanyl ).  She lives with her aunt and 2 children ages 62 and 29.  She has a court date tomorrow 03/06/2022 for probation violation..  She is unemployed.  She currently does not take any prescription medications.  She does not have any psychiatric resources in place. Her next prenatal appointment is March 10, 2023.  On evaluation Laura Callahan presents to [facility] UC requesting fentanyl  detox.  She is 6 months pregnant and is concerned that if she goes through withdrawals that she will hurt her baby.  States about 6 months ago she began using fentanyl  daily along with occasional cocaine  and marijuana use.  She was able to stay sober for roughly 60 days.  She was prescribed Suboxone  8-2 mg strips 3 times daily.  She relapsed 1-2 months ago.  She is certain that she has used fentanyl  daily for at least the past 30 days.  She is snorting 1-2 g/day.  Her last use was roughly 3 hours ago.  She is currently denying any withdrawal symptoms.  She has not taken any of the Suboxone  this month even though she has filled medication.  She is using cocaine  and marijuana roughly 1-2 times a week.  She is unsure of the amount that she consumes at each use.  During evaluation Laura Callahan is observed sitting in the assessment room in no acute distress.  She is  casually dressed and makes good eye contact.  She is alert/oriented x 4, calm, cooperative, and attentive.  She has normal speech and behavior.  Denies depression but does express her frustration due to her normal abuse and dependence.  She has a depressed affect.  She denies SI/HI/AVH.  She denies access to firearms/weapons.  She verbally contracts for safety.  She denies auditory/visual hallucinations.  She does not appear manic, psychotic, paranoid or delusional.  She does not appear to be responding to internal/external stimuli. Patient answered question appropriately.    Contacted Dr. Von at [facility] [phone] -she will discuss case with the attending Dr. Izell.  Per Dr. Izell patient can be admitted directly to the [facility] specialty care unit.  She will be transferred via not MS  Flowsheet Row ED from 03/06/2023 in [facility] ED from 03/03/2023 in [facility] Admission (Discharged) from 02/17/2023 in [facility] 1S Maternity Assessment Unit  C-SSRS RISK CATEGORY No Risk No Risk No Risk       Psychiatric Specialty Exam  Presentation  General Appearance:Casual  Eye Contact:Good  Speech:Clear and Coherent; Normal Rate  Speech Volume:Normal  Handedness:Right   Mood and Affect  Mood: Depressed  Affect: Congruent   Thought Process  Thought Processes: Coherent  Descriptions of Associations:Intact  Orientation:Full (Time, Place and Person)  Thought Content:Logical    Hallucinations:None  Ideas of Reference:None  Suicidal Thoughts:No  Homicidal Thoughts:No   Sensorium  Memory:  Immediate Good; Recent Good; Remote Good  Judgment: Poor  Insight: Poor   Executive Functions  Concentration: Good  Attention Span: Good  Recall: Good  Fund of Knowledge: Good  Language: Good   Psychomotor Activity  Psychomotor Activity: Normal   Assets  Assets: Communication Skills; Desire for Improvement;  Physical Health; Resilience; Social Support   Sleep  Sleep: Poor  Number of hours:  6   Physical Exam: Physical Exam Vitals and nursing note reviewed.  Constitutional:      General: She is not in acute distress.    Appearance: Normal appearance. She is not ill-appearing.  Eyes:     General:        Right eye: No discharge.        Left eye: No discharge.  Cardiovascular:     Rate and Rhythm: Normal rate.  Pulmonary:     Effort: Pulmonary effort is normal. No respiratory distress.  Musculoskeletal:        General: Normal range of motion.     Cervical back: Normal range of motion.  Skin:    Coloration: Skin is not jaundiced or pale.  Neurological:     Mental Status: She is alert and oriented to person, place, and time.  Psychiatric:        Attention and Perception: Attention and perception normal.        Mood and Affect: Affect normal. Mood is depressed.        Speech: Speech normal.        Behavior: Behavior normal. Behavior is cooperative.        Thought Content: Thought content normal.        Cognition and Memory: Cognition normal.        Judgment: Judgment normal.    Review of Systems  Constitutional:  Negative for chills and fever.  HENT:  Negative for hearing loss.   Respiratory:  Negative for cough and shortness of breath.   Cardiovascular:  Negative for chest pain.  Gastrointestinal:  Negative for abdominal pain and nausea.  Musculoskeletal: Negative.   Neurological:  Negative for dizziness.  Psychiatric/Behavioral:  Positive for depression and substance abuse.    Blood pressure (!) 110/56, pulse 68, temperature 98.3 F (36.8 C), temperature source Oral, resp. rate 18, SpO2 100%, unknown if currently breastfeeding. There is no height or weight on file to calculate BMI.  Musculoskeletal: Strength & Muscle Tone: within normal limits Gait & Station: normal Patient leans: N/A   [facility] MSE Discharge Disposition for Follow up and Recommendations:   Patient  presents to [facility] UC requesting fentanyl  detox.  However patient is 6 months pregnant and will need a higher level care.  Contacted Dr. Von at [facility] [phone] -she will discuss case with the attending Dr. Izell.  Per Dr. Izell patient can be a direct admit to [facility] specialty care.  Elveria VEAR Batter, NP 03/06/2023, 6:36 PM

## 2023-03-06 NOTE — ED Notes (Signed)
 Patient A&Ox4. Patient informed of transfer to [facility] Specialty Care. Denies intent to harm self/others when asked. Denies A/VH. Patient denies any physical complaints when asked. No acute distress noted. Support and encouragement provided. Routine safety checks conducted according to facility protocol. Encouraged patient to notify staff if thoughts of harm toward self or others arise. Patient verbalize understanding and agreement. Will continue to monitor for safety.

## 2023-03-07 ENCOUNTER — Observation Stay (HOSPITAL_COMMUNITY): Payer: 59

## 2023-03-07 DIAGNOSIS — F1193 Opioid use, unspecified with withdrawal: Secondary | ICD-10-CM

## 2023-03-07 DIAGNOSIS — Z3A25 25 weeks gestation of pregnancy: Secondary | ICD-10-CM

## 2023-03-07 DIAGNOSIS — O26892 Other specified pregnancy related conditions, second trimester: Secondary | ICD-10-CM

## 2023-03-07 DIAGNOSIS — O99322 Drug use complicating pregnancy, second trimester: Secondary | ICD-10-CM

## 2023-03-07 LAB — RAPID URINE DRUG SCREEN, HOSP PERFORMED
Amphetamines: NOT DETECTED
Barbiturates: NOT DETECTED
Benzodiazepines: NOT DETECTED
Cocaine: POSITIVE — AB
Opiates: NOT DETECTED
Tetrahydrocannabinol: NOT DETECTED

## 2023-03-07 LAB — RPR: RPR Ser Ql: NONREACTIVE

## 2023-03-07 MED ORDER — ACETAMINOPHEN 500 MG PO TABS: 1000.0000 mg | ORAL_TABLET | Freq: Four times a day (QID) | ORAL | Status: DC | PRN

## 2023-03-07 MED ORDER — ACETAMINOPHEN 325 MG PO TABS
325.0000 mg | ORAL_TABLET | Freq: Once | ORAL | Status: AC
  Administered 2023-03-07: 325 mg via ORAL
  Filled 2023-03-07: qty 1

## 2023-03-07 MED ORDER — BUPRENORPHINE HCL 2 MG SL SUBL
4.0000 mg | SUBLINGUAL_TABLET | SUBLINGUAL | Status: DC | PRN
  Administered 2023-03-08: 4 mg via SUBLINGUAL
  Filled 2023-03-07: qty 2

## 2023-03-07 MED ORDER — NICOTINE 21 MG/24HR TD PT24
21.0000 mg | MEDICATED_PATCH | Freq: Every day | TRANSDERMAL | Status: DC
  Administered 2023-03-07 – 2023-03-10 (×4): 21 mg via TRANSDERMAL
  Filled 2023-03-07 (×4): qty 1

## 2023-03-07 NOTE — Progress Notes (Signed)
 Patient ID: Laura Callahan, female   DOB: 09/11/2000, 23 y.o.   MRN: [id number]  FACULTY PRACTICE ANTEPARTUM NOTE  KAISLEE CHAO is a 23 y.o. H4E8877 at [redacted]w[redacted]d  who is admitted for Opioid withdrawal.   Fetal presentation is unsure. Length of Stay:  1  Days  Subjective: Having mild withdrawal symptoms. COWS score 5. Started using a little over a year ago - oxycodone . Was experimenting with a number of different things and started using opioids. Quickly switched to fentanyl  when couldn't find oxycodone . Did come off when she was in prison. Was on subutex . Did fairly well on that. When she was released from prison, had no transition and didn't have a prescription. Started using again.   Patient reports good fetal movement.     Vitals:  Blood pressure 120/63, pulse 68, temperature 98.2 F (36.8 C), temperature source Oral, resp. rate 17, weight 66.8 kg, SpO2 100%, unknown if currently breastfeeding. Physical Examination:  General appearance - alert, well appearing, and in no distress and oriented to person, place, and time Chest - clear to auscultation, no wheezes, rales or rhonchi, symmetric air entry Heart - normal rate, regular rhythm, normal S1, S2, no murmurs, rubs, clicks or gallops Extremities - peripheral pulses normal, no pedal edema, no clubbing or cyanosis Extremities: extremities normal, atraumatic, no cyanosis or edema   Fetal Monitoring:   dopplers normal  Labs:  Results for orders placed or performed during the hospital encounter of 03/06/23 (from the past 24 hours)  Type and screen [facility]   Collection Time: 03/06/23 10:01 PM  Result Value Ref Range   ABO/RH(D) A POS    Antibody Screen NEG    Sample Expiration      03/09/2023,2359 Performed at [facility], [address] [place], [place] [address]   Hepatitis B surface antigen   Collection Time: 03/06/23 10:05 PM  Result Value Ref Range   Hepatitis B Surface Ag NON REACTIVE NON  REACTIVE  RPR   Collection Time: 03/06/23 10:05 PM  Result Value Ref Range   RPR Ser Ql NON REACTIVE NON REACTIVE  CBC   Collection Time: 03/06/23 10:05 PM  Result Value Ref Range   WBC 6.7 4.0 - 10.5 K/uL   RBC 3.29 (L) 3.87 - 5.11 MIL/uL   Hemoglobin 10.1 (L) 12.0 - 15.0 g/dL   HCT 68.7 (L) 63.9 - 53.9 %   MCV 94.8 80.0 - 100.0 fL   MCH 30.7 26.0 - 34.0 pg   MCHC 32.4 30.0 - 36.0 g/dL   RDW 86.0 88.4 - 84.4 %   Platelets 259 150 - 400 K/uL   nRBC 0.0 0.0 - 0.2 %  Differential   Collection Time: 03/06/23 10:05 PM  Result Value Ref Range   Neutrophils Relative % 61 %   Neutro Abs 4.1 1.7 - 7.7 K/uL   Lymphocytes Relative 30 %   Lymphs Abs 2.0 0.7 - 4.0 K/uL   Monocytes Relative 5 %   Monocytes Absolute 0.4 0.1 - 1.0 K/uL   Eosinophils Relative 3 %   Eosinophils Absolute 0.2 0.0 - 0.5 K/uL   Basophils Relative 1 %   Basophils Absolute 0.0 0.0 - 0.1 K/uL   Immature Granulocytes 0 %   Abs Immature Granulocytes 0.02 0.00 - 0.07 K/uL  HIV Antibody (routine testing w rflx)   Collection Time: 03/06/23 10:05 PM  Result Value Ref Range   HIV Screen 4th Generation wRfx Non Reactive Non Reactive  Rapid  urine drug screen (hospital performed) Not at [facility]   Collection Time: 03/07/23  9:55 AM  Result Value Ref Range   Opiates NONE DETECTED NONE DETECTED   Cocaine  POSITIVE (A) NONE DETECTED   Benzodiazepines NONE DETECTED NONE DETECTED   Amphetamines NONE DETECTED NONE DETECTED   Tetrahydrocannabinol NONE DETECTED NONE DETECTED   Barbiturates NONE DETECTED NONE DETECTED    Imaging Studies:      Medications:  Scheduled  dicyclomine   20 mg Oral TID AC   docusate sodium   100 mg Oral Daily   prenatal multivitamin  1 tablet Oral Q1200   sodium chloride  flush  3 mL Intravenous Q12H   I have reviewed the patient's current medications.  ASSESSMENT: Principal Problem:   Opiate withdrawal (HCC) Active Problems:   Bipolar I disorder (HCC)   [redacted] weeks gestation of  pregnancy   PLAN: Continue symptomatic treatment for now. Transition to subutex  with COWs >12.   Aslee Such J, DO 03/07/2023,11:24 AM

## 2023-03-08 DIAGNOSIS — F1120 Opioid dependence, uncomplicated: Secondary | ICD-10-CM | POA: Diagnosis present

## 2023-03-08 LAB — URINE CULTURE

## 2023-03-08 LAB — RUBELLA SCREEN: Rubella: 2.13 {index} (ref >0.99)

## 2023-03-08 MED ORDER — BUPRENORPHINE HCL 2 MG SL SUBL
4.0000 mg | SUBLINGUAL_TABLET | Freq: Once | SUBLINGUAL | Status: AC
  Administered 2023-03-08: 4 mg via SUBLINGUAL
  Filled 2023-03-08: qty 2

## 2023-03-08 MED ORDER — BUPRENORPHINE HCL 8 MG SL SUBL
8.0000 mg | SUBLINGUAL_TABLET | Freq: Three times a day (TID) | SUBLINGUAL | Status: DC
  Administered 2023-03-08 – 2023-03-10 (×6): 8 mg via SUBLINGUAL
  Filled 2023-03-08 (×6): qty 1

## 2023-03-08 MED ORDER — TRAZODONE HCL 50 MG PO TABS
50.0000 mg | ORAL_TABLET | Freq: Three times a day (TID) | ORAL | Status: DC | PRN
  Administered 2023-03-08: 50 mg via ORAL
  Filled 2023-03-08 (×2): qty 1

## 2023-03-08 MED ORDER — TRAZODONE HCL 50 MG PO TABS
50.0000 mg | ORAL_TABLET | ORAL | Status: AC
  Administered 2023-03-08: 50 mg via ORAL
  Filled 2023-03-08: qty 1

## 2023-03-08 MED ORDER — CLONIDINE HCL 0.1 MG PO TABS
0.1000 mg | ORAL_TABLET | ORAL | Status: AC
  Administered 2023-03-08: 0.1 mg via ORAL
  Filled 2023-03-08: qty 1

## 2023-03-08 NOTE — Progress Notes (Signed)
 Patient ID: Laura Callahan, female   DOB: September 14, 2000, 23 y.o.   MRN: [id number] FACULTY PRACTICE ANTEPARTUM(COMPREHENSIVE) NOTE  Laura Callahan is a 23 y.o. H4E8877 at [redacted]w[redacted]d who is admitted for opiate withdrawal.   Fetal presentation is unsure. Length of Stay:  2  Days  Subjective: Leg and arm pain and RLS Patient reports the fetal movement as active. Patient reports uterine contraction  activity as none. Patient reports  vaginal bleeding as none. Patient describes fluid per vagina as None.  Vitals:  Blood pressure 110/61, pulse 65, temperature 98.1 F (36.7 C), temperature source Oral, resp. rate 18, weight 66.8 kg, SpO2 99%, unknown if currently breastfeeding. Physical Examination:  General appearance - alert, well appearing, and in no distress Heart - normal rate and regular rhythm Abdomen - soft, nontender, nondistended Fundal Height:  size equals dates Membranes:intact  Fetal Monitoring:  Fetal Heart Rate A   Mode Doppler filed at 03/08/2023 1101  Baseline Rate (A) 155 bpm filed at 03/08/2023 1101  Variability 6-25 BPM filed at 03/08/2023 0059  Accelerations 15 x 15 filed at 03/08/2023 0059  Decelerations None filed at 03/08/2023 9940   Labs:  Results for orders placed or performed during the hospital encounter of 03/06/23 (from the past 24 hours)  Rapid urine drug screen (hospital performed) Not at [facility]   Collection Time: 03/07/23  9:55 AM  Result Value Ref Range   Opiates NONE DETECTED NONE DETECTED   Cocaine  POSITIVE (A) NONE DETECTED   Benzodiazepines NONE DETECTED NONE DETECTED   Amphetamines NONE DETECTED NONE DETECTED   Tetrahydrocannabinol NONE DETECTED NONE DETECTED   Barbiturates NONE DETECTED NONE DETECTED     Medications:  Scheduled  dicyclomine   20 mg Oral TID AC   docusate sodium   100 mg Oral Daily   nicotine   21 mg Transdermal Daily   prenatal multivitamin  1 tablet Oral Q1200   sodium chloride  flush  3 mL Intravenous Q12H   I have reviewed the  patient's current medications.  ASSESSMENT: Patient Active Problem List   Diagnosis Date Noted   Uncomplicated opioid dependence (HCC) 03/08/2023   Opiate withdrawal (HCC) 03/06/2023   Bipolar I disorder (HCC) 03/06/2023   [redacted] weeks gestation of pregnancy 03/06/2023   Preterm uterine contractions in third trimester, antepartum 06/16/2018   Hypoglycemia 02/12/2013   PTSD (post-traumatic stress disorder) 02/09/2012   ADHD (attention deficit hyperactivity disorder), combined type 02/09/2012   Conduct disorder, 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    PLAN: Start Subutex  and follow COWS score  Lynwood Solomons 03/08/2023,9:43 AM

## 2023-03-08 NOTE — Progress Notes (Signed)
 Pt states she is too restless for an NST this morning. PRN subutex given and doppler obtained. Pt reports good fetal movement and FHR  is 155. Dr. Debroah Loop made aware. No new orders received.

## 2023-03-09 DIAGNOSIS — Z3A26 26 weeks gestation of pregnancy: Secondary | ICD-10-CM

## 2023-03-09 DIAGNOSIS — O99019 Anemia complicating pregnancy, unspecified trimester: Secondary | ICD-10-CM | POA: Diagnosis present

## 2023-03-09 LAB — GC/CHLAMYDIA PROBE AMP (~~LOC~~) NOT AT ARMC
Chlamydia: NEGATIVE
Comment: NEGATIVE
Comment: NORMAL
Neisseria Gonorrhea: NEGATIVE

## 2023-03-09 MED ORDER — ONDANSETRON 4 MG PO TBDP: 4.0000 mg | ORAL_TABLET | Freq: Three times a day (TID) | ORAL | Status: DC | PRN

## 2023-03-09 MED ORDER — LACTATED RINGERS BOLUS PEDS: 1000.0000 mL | Freq: Once | INTRAVENOUS | Status: DC

## 2023-03-09 MED ORDER — POLYETHYLENE GLYCOL 3350 17 G PO PACK: 17.0000 g | PACK | Freq: Every day | ORAL | Status: DC

## 2023-03-09 MED ORDER — SODIUM CHLORIDE 0.9 % IV SOLN
500.0000 mg | Freq: Once | INTRAVENOUS | Status: AC
  Administered 2023-03-09: 500 mg via INTRAVENOUS
  Filled 2023-03-09: qty 25

## 2023-03-09 MED ORDER — LACTATED RINGERS IV BOLUS
1000.0000 mL | Freq: Once | INTRAVENOUS | Status: AC
  Administered 2023-03-09: 1000 mL via INTRAVENOUS

## 2023-03-09 NOTE — Progress Notes (Signed)
 Patient ID: Laura Callahan, female   DOB: [birth year], 23 y.o.   MRN: [id number] FACULTY PRACTICE ANTEPARTUM(COMPREHENSIVE) NOTE  Laura Callahan is a 23 y.o. H4E8877 at [redacted]w[redacted]d by early ultrasound who is admitted for opioid withdrawal, detox.   Fetal presentation is unsure. Length of Stay:  3  Days  Subjective: Feels better mild jitters Patient reports the fetal movement as active. Patient reports uterine contraction  activity as none. Patient reports  vaginal bleeding as none. Patient describes fluid per vagina as None.  Vitals:  Blood pressure (!) 98/51, pulse 64, temperature 97.8 F (36.6 C), temperature source Oral, resp. rate 16, weight 66.8 kg, SpO2 96%, unknown if currently breastfeeding. Physical Examination:  General appearance - alert, well appearing, and in no distress Heart - normal rate and regular rhythm Abdomen - soft, nontender, nondistended Fundal Height:  size equals dates. Extremities: extremities normal, atraumatic, no cyanosis or edema and Homans sign is negative, no sign of DVT  Membranes:intact  Fetal Monitoring:   Fetal Heart Rate A   Mode External filed at 03/08/2023 2045  Baseline Rate (A) 140 bpm filed at 03/08/2023 2045  Variability 6-25 BPM, <5 BPM filed at 03/08/2023 2045  Accelerations 10 x 10 filed at 03/08/2023 2045  Decelerations Variable filed at 03/08/2023 2045    Labs:  No results found for this or any previous visit (from the past 24 hours).   Medications:  Scheduled  buprenorphine   8 mg Sublingual Q8H   dicyclomine   20 mg Oral TID AC   docusate sodium   100 mg Oral Daily   nicotine   21 mg Transdermal Daily   prenatal multivitamin  1 tablet Oral Q1200   sodium chloride  flush  3 mL Intravenous Q12H   I have reviewed the patient's current medications.  ASSESSMENT: Patient Active Problem List   Diagnosis Date Noted   Uncomplicated opioid dependence (HCC) 03/08/2023   Opiate withdrawal (HCC) 03/06/2023   Bipolar I disorder (HCC)  03/06/2023   [redacted] weeks gestation of pregnancy 03/06/2023   Preterm uterine contractions in third trimester, antepartum 06/16/2018   Hypoglycemia 02/12/2013   PTSD (post-traumatic stress disorder) 02/09/2012   ADHD (attention deficit hyperactivity disorder), combined type 02/09/2012   Conduct disorder, 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    PLAN: Subutex  8 mg po Q 8 hr for OUD  Laura Callahan 03/09/2023,10:42 AM

## 2023-03-10 ENCOUNTER — Ambulatory Visit (INDEPENDENT_AMBULATORY_CARE_PROVIDER_SITE_OTHER): Payer: 59 | Admitting: Advanced Practice Midwife

## 2023-03-10 VITALS — BP 119/68 | HR 57 | Wt 151.4 lb

## 2023-03-10 DIAGNOSIS — O99322 Drug use complicating pregnancy, second trimester: Secondary | ICD-10-CM

## 2023-03-10 DIAGNOSIS — F1920 Other psychoactive substance dependence, uncomplicated: Secondary | ICD-10-CM

## 2023-03-10 DIAGNOSIS — Z3A26 26 weeks gestation of pregnancy: Secondary | ICD-10-CM

## 2023-03-10 DIAGNOSIS — F1120 Opioid dependence, uncomplicated: Secondary | ICD-10-CM | POA: Diagnosis not present

## 2023-03-10 DIAGNOSIS — O0990 Supervision of high risk pregnancy, unspecified, unspecified trimester: Secondary | ICD-10-CM

## 2023-03-10 DIAGNOSIS — O219 Vomiting of pregnancy, unspecified: Secondary | ICD-10-CM

## 2023-03-10 DIAGNOSIS — K219 Gastro-esophageal reflux disease without esophagitis: Secondary | ICD-10-CM

## 2023-03-10 DIAGNOSIS — F4310 Post-traumatic stress disorder, unspecified: Secondary | ICD-10-CM

## 2023-03-10 DIAGNOSIS — O99612 Diseases of the digestive system complicating pregnancy, second trimester: Secondary | ICD-10-CM

## 2023-03-10 DIAGNOSIS — F319 Bipolar disorder, unspecified: Secondary | ICD-10-CM

## 2023-03-10 DIAGNOSIS — O99320 Drug use complicating pregnancy, unspecified trimester: Secondary | ICD-10-CM

## 2023-03-10 LAB — VITAMIN B12: Vitamin B-12: 238 pg/mL (ref 180–914)

## 2023-03-10 LAB — IRON AND TIBC
Iron: 307 ug/dL — ABNORMAL HIGH (ref 28–170)
Saturation Ratios: 94 % — ABNORMAL HIGH (ref 10.4–31.8)
TIBC: 328 ug/dL (ref 250–450)
UIBC: 21 ug/dL

## 2023-03-10 LAB — GLUCOSE, 1 HOUR GESTATIONAL: Glucose Tolerance, 1 hour: 100 mg/dL

## 2023-03-10 LAB — FERRITIN: Ferritin: 37 ng/mL (ref 11–307)

## 2023-03-10 LAB — FOLATE: Folate: 12.1 ng/mL (ref >5.9)

## 2023-03-10 MED ORDER — PROMETHAZINE HCL 25 MG PO TABS: 25.0000 mg | ORAL_TABLET | Freq: Four times a day (QID) | ORAL | 3 refills | Status: DC | PRN

## 2023-03-10 MED ORDER — DICYCLOMINE HCL 10 MG PO CAPS
10.0000 mg | ORAL_CAPSULE | Freq: Three times a day (TID) | ORAL | 2 refills | Status: DC
Start: 2023-03-10 — End: 2023-05-29

## 2023-03-10 MED ORDER — TRAZODONE HCL 50 MG PO TABS: 50.0000 mg | ORAL_TABLET | Freq: Every day | ORAL | 3 refills | Status: DC

## 2023-03-10 MED ORDER — BUPRENORPHINE HCL 8 MG SL SUBL: 8.0000 mg | SUBLINGUAL_TABLET | Freq: Three times a day (TID) | SUBLINGUAL | 0 refills | Status: DC

## 2023-03-10 MED ORDER — DICYCLOMINE HCL 10 MG PO CAPS: 10.0000 mg | ORAL_CAPSULE | Freq: Three times a day (TID) | ORAL | 2 refills | Status: DC

## 2023-03-10 MED ORDER — OMEPRAZOLE MAGNESIUM 20 MG PO TBEC: 20.0000 mg | DELAYED_RELEASE_TABLET | Freq: Two times a day (BID) | ORAL | 3 refills | Status: DC

## 2023-03-10 MED ORDER — BUPRENORPHINE HCL-NALOXONE HCL 8-2 MG SL FILM: 1.0000 | ORAL_FILM | Freq: Three times a day (TID) | SUBLINGUAL | 0 refills | Status: DC

## 2023-03-10 NOTE — Progress Notes (Signed)
 INITIAL PRENATAL VISIT  Subjective:   Laura Callahan is 23 y.o. [phone] female being seen today for her first obstetrical visit. She has received other prenatal care at [facility] prison previously this pregnancy. Records not available. This is not a planned pregnancy. This is a desired pregnancy.  She is at [redacted]w[redacted]d gestation by 10 week US . Her obstetrical history is significant for  OUD, history of spontaneous preterm delivery at 34 weeks w/ second baby . Here with support person. Patient does intend to breast feed. Pregnancy history fully reviewed.  Pt was admitted to [facility] for opiate withdrawal and was discharged today.. Was started on Subutex  and Trazodone  inpatient. Reports feeling better on Subutex  then she did previously on Suboxone . Suboxone  made her feel anxious and sick and she thinks it lead her to relapse. Had started using Fentanyl  again. Does not have custody of her two children.   Reports continued N/V 2-3 times per day while taking Zofran  4 mg. States Phenergan  works better for her. Also reports frequent feeling of acid in her throat and tasting prenatal vitamin. Has taken Omeprazole  in the past with good results.   Review of Systems:   ROS heartburn, nausea, no bleeding, no contractions, and vomiting.  Objective:    Obstetric History OB History  Gravida Para Term Preterm AB Living  5 2 1 1 2 2   SAB IAB Ectopic Multiple Live Births  2 0 0 0 2    # Outcome Date GA Lbr Len/2nd Weight Sex Type Anes PTL Lv  5 Current           4 SAB 08/2020          3 Preterm 06/20/18 [redacted]w[redacted]d 06:45 / 00:07 5 lb 11.4 oz (2.59 kg) M Vag-Spont EPI  LIV  2 Term 08/27/16 [redacted]w[redacted]d 06:54 / 00:37 5 lb 14.4 oz (2.676 kg) M Vag-Spont EPI  LIV  1 SAB 08/2014            Past Medical History:  Diagnosis Date   Asthma    Attention deficit    Bipolar 1 disorder (HCC)    Depression    Headache(784.0)    Normal pregnancy in third trimester 08/27/2016   SVD (spontaneous vaginal delivery) 08/27/2016    Past  Surgical History:  Procedure Laterality Date   addenoidectomy     TONSILLECTOMY      Current Outpatient Medications on File Prior to Visit  Medication Sig Dispense Refill   Prenatal Vit-Fe Fumarate-FA (PRENATAL MULTIVITAMIN) TABS tablet Take 1 tablet by mouth daily at 12 noon.     promethazine  (PHENERGAN ) 25 MG suppository Place 1 suppository (25 mg total) rectally every 6 (six) hours as needed for nausea or vomiting. 12 each 0   No current facility-administered medications on file prior to visit.    Allergies  Allergen Reactions   Flax Seed Oil [Flax Seed Oil] Swelling    blisters   Red Dye #40 (Allura Red) Nausea And Vomiting   Codeine Nausea And Vomiting and Other (See Comments)    Blisters as a child- tolerates percocet and other pain medications without issue    Social History:  reports that she quit smoking about 5 years ago. Her smoking use included cigarettes. She has never used smokeless tobacco. She reports that she does not currently use alcohol. She reports current drug use. Drugs: Marijuana and Fentanyl .  Family History  Problem Relation Age of Onset   Depression Sister    Depression Mother    Anxiety  disorder Mother    Drug abuse Mother    Anxiety disorder Sister    Drug abuse Cousin     The following portions of the patient's history were reviewed and updated as appropriate: allergies, current medications, past family history, past medical history, past social history, past surgical history and problem list.  Physical Exam:   CONSTITUTIONAL: Well-developed, well-nourished female in no acute distress.  HENT:  Normocephalic, atraumatic. Oropharynx is clear and moist EYES: Conjunctivae normal. No scleral icterus.  SKIN: Skin is warm and dry. No rash noted. Not diaphoretic. No erythema. No pallor. MUSCULOSKELETAL: Normal range of motion. No tenderness.  No cyanosis, clubbing, or edema.   NEUROLOGIC: Alert and oriented to person, place, and time. Normal muscle  tone coordination.  PSYCHIATRIC: Normal mood and affect. Normal behavior. Normal judgment and thought content. CARDIOVASCULAR: Normal heart rate noted. RESPIRATORY: Effort and rate normal. BREASTS: Declined ABDOMEN: Soft, no distention, tenderness, rebound or guarding. Fundal ht: 26 PELVIC: Normal appearing external genitalia; normal appearing vaginal mucosa and cervix.  No abnormal discharge noted.  Pap smear not obtained.  Uterus S=D, no other palpable masses, no uterine or adnexal tenderness. Fetal Status: FHR 145  1 hour GTT 100   Assessment:   Pregnancy: H4E8877 1. Nausea/vomiting in pregnancy (Primary)  - promethazine  (PHENERGAN ) 25 MG tablet; Take 1 tablet (25 mg total) by mouth every 6 (six) hours as needed for nausea or vomiting.  Dispense: 60 tablet; Refill: 3  2. Gastroesophageal reflux during pregnancy in second trimester, antepartum  - omeprazole  (PRILOSEC  OTC) 20 MG tablet; Take 1 tablet (20 mg total) by mouth in the morning and at bedtime.  Dispense: 60 tablet; Refill: 3  3. Supervision of high risk pregnancy, antepartum  - US  MFM OB FOLLOW UP; Future - Culture, OB Urine  4. Uncomplicated opioid dependence (HCC) - Explained REACH clinic routine , recommendation but not requirement for regular drug testing. Pt voices understand and agrees to testing today. Hopes to be able to parent this child and would like to demonstrate her commitment to recovery. .  GLENWOOD Mathieu Select 13 (MW), Urine - dicyclomine  (BENTYL ) 10 MG capsule; Take 1 capsule (10 mg total) by mouth 3 (three) times daily before meals.  Dispense: 90 capsule; Refill: 2 - Buprenorphine  HCl-Naloxone  HCl (SUBOXONE ) 8-2 MG FILM; Place 1 Film under the tongue 3 (three) times daily.  Dispense: 90 Film; Refill: 0  5. Bipolar I disorder (HCC) - Met briefly w/ Jamie McMannus today.   6. PTSD (post-traumatic stress disorder)  - traZODone  (DESYREL ) 50 MG tablet; Take 1 tablet (50 mg total) by mouth at bedtime.   Dispense: 90 tablet; Refill: 3     Plan:  Initial labs reviewed. Prenatal vitamins. ASA for reduction of risk for preeclampsia not indicated.  Problem list reviewed and updated. Genetic screening discussed: NIPS/First trimester screen/Quad/AFP requested. Too late for AFP. Will drawn NIPS, Hep C NV due to lab has left for the day.  Role of ultrasound in pregnancy discussed; Anatomy US : results reviewed. Amniocentesis discussed: not indicated. Follow up in 1 weeks. REACH Discussed clinic routines, schedule of care and testing, genetic screening options, involvement of students and residents under the direct supervision of APPs and doctors and presence of female providers. Pt verbalized understanding. Dr. Lola discussed   Neeraj Housand  Claudene, CNM 03/10/2023 4:37 PM

## 2023-03-10 NOTE — Progress Notes (Signed)
 Initial substance exposed newborn consult complete in community room. Extended postpartum/newborn stay reviewed as well as Eat, Sleep, Console management. Substance Exposed Newborn pamphlet given with consult contact information. Mom initially plans to see community family practice; encouraged to consider pediatrician for first year of newborn care. Will follow at next visit on 1/16.

## 2023-03-10 NOTE — Clinical SW OB High Risk (Addendum)
 OB Specialty Care  Clinical Social Worker:  Nat DELENA Quiet, [place] Date/Time:  03/10/2023, 10:32 AM Gestational Age on Admission:  23 y.o. Admitting Diagnosis:  Opiate use disorder    Expected Delivery Date:  06/06/23  [facility] Environment  Home Address:  [address] [place] [address] [place], [place] [address]  Household Member/Support Name:   Augustin Rudder, mother, 02/15/1980 Lidia Clavijo, brother, 05/05/2004  Relationship:    Other Support:  Grandmother    Psychosocial Data  Employment:  Unemployed  Type of Work:    Education: Some Science Writer Care:  Opioid Use disorder  Patient is currently [redacted] weeks pregnant and snorts 1-2 gms of fentanyl  daily. Has tried Suboxone  in the past and wants to do this. She is going to [facility] clinic and is scheduled for first visit on 03/10/2023.   Strengths/Weaknesses/Factors to Consider  Concerns Related to Hospitalization:  Substance Use disorder.    Previous Pregnancies/Feelings Towards Pregnancy?  Concerns related to being/becoming a mother?:  MOB expressed feeling excited about the pregnancy, and stated I get another chance to do it right this time. MOB reported that her grandmother has custody of her six-year-old son and [facility] CPS terminated her parental rights to her four-year-old child. MOB explained that CPS took the child into custody because she had to go to jail and when she was released, she did not follow through with her case plan. CSW inquired if MOB had any current legal matters. MOB reported, 'all my legal stuff is behind me."     Social Support (FOB? Who is/will be helping with baby/other kids?): Immediate Family. MOB reported that she is not sure if the FOB will be involved, it's a possibility.     Recent Stressful Life Events (life changes in past year?):    MOB reported that she violated her parole in October 2023 and was sentenced to ninety days in jail. During  this time, she received Suboxone  treatment, however when she left the facility, they did not prescribe the maintenance medication ongoing, so she relapsed. MOB reported that she desires to continue treatment at this time.    Prenatal Care/Education/Home Preparations: MOB reported that she believes that she will be able to get infant items. CSW informed MOB after delivery the hospital social worker will see her and can assess for additional needs at that time.   CSW asked if MOB had WIC/FS. MOB reported that she does not have active WIC.FS benefits. CSW provided MOB with resources to follow up with WIC and SNAP. MOB reported that she would go to DSS today since she would be in the area for her prenatal appointment.    Domestic Violence (of any type):  No If Yes to Domestic Violence, Describe/Action Plan:  MOB denied Domestic Violence concerns.    Substance Use During Pregnancy: Yes    Patient Advised/Response:  MOB reported that she will call and follow up with the [facility] today.    Clinical Assessment/Plan:   CSW met with MOB at bedside and introduced CSW role. CSW observed MOB in bed and was receptive to the visit. MOB presented calm and oriented. CSW asked MOB how she has been doing. MOB reported that she feels a lot better. MOB reported that she was prescribed Subutex  and denied having cravings. CSW acknowledged MOB willingness to accept treatment. MOB reported that she has a prenatal care appointment at [facility]. CSW inquired about MOB household.  MOB reported that she lives with her mother and younger brother. She identified her mom and grandmother as supports. MOB shared, "she has been there for me throughout the pregnancy."    CSW inquired about MOB mental health history. MOB acknowledged that she has a history of bipolar, depression and anxiety. MOB reported she was diagnosed with bipolar depression and anxiety in 2016. At the time she was  prescribed a high dose of Depakote  but felt the medication did not treat her symptoms. MOB reported since 2018, she has been self-medicating. She reported that she used Percocet's and then started using fentanyl  last year. MOB reported daily use. MOB denied using other substances. CSW discussed mental health treatment and offered resources. MOB reported that the was familiar with [facility] center. CSW encouraged MOB to follow up with a psychiatrist for medication management and counseling. CSW offered to make the appointment however, MOB reported that she would follow up on her own. CSW assessed MOB for safety. MOB denied domestic violence concerns and SI/HI.    CSW inquired about transportation needs. MOB reported that her grandmother provides transportation. CSW educated MOB about Medicaid transportation. MOB reported that the [facility] plan is affiliated with [employer]. CSW provided resources for MOB to contact Medicaid for rides.   CSW inquired MOB food insecurity. MOB reported that her mother  receives SNAP benefits. CSW provided MOB with a list of food resources and encouraged her to follow up. CSW inquried about housing concern. MOB reported that she has a case [employer] in [place] that is currently working to secure housing for her.   CSW assessed MOB for additional needs. MOB reported none.   Nat Quiet, MSW, LCSW Clinical Social Worker  [phone] 03/10/2023  11:22 AM

## 2023-03-10 NOTE — Plan of Care (Signed)
 Pt to be discharged with printed paperwork. Carmelina Dane, RN

## 2023-03-10 NOTE — Discharge Summary (Signed)
 Physician Discharge Summary  Patient ID: HONI NAME MRN: [id number] DOB/AGE: May 29, 2000 23 y.o.  Admit date: 03/06/2023 Discharge date: 03/10/2023  Admission Diagnoses: Opiate withdrawal in pregnancy  Discharge Diagnoses:  Principal Problem:   Opiate withdrawal (HCC) Active Problems:   Bipolar I disorder (HCC)   [redacted] weeks gestation of pregnancy   Uncomplicated opioid dependence (HCC)   Anemia in pregnancy   Discharged Condition: good  Hospital Course: Patient admitted at [redacted] weeks gestation with opiate use disorder with opiate withdrawal. Patient was started on Subutex  on 03/08/23. Patient remained well on a dosing of 8 mg every 8 hours. Patient found stable for discharge with plans to follow up for new ob care this afternoon. Patient was informed that her suboxone  prescription will be provided at the time of her new ob appointment. Patient verbalized understanding and all questions were answered. Patient passed 1 hour glucola prior to discharge  Consults: None   Discharge Exam: Blood pressure (!) 83/37, pulse (!) 48, temperature 97.6 F (36.4 C), temperature source Oral, resp. rate 18, weight 66.8 kg, SpO2 96%, unknown if currently breastfeeding. GENERAL: Well-developed, well-nourished female in no acute distress.  LUNGS: Clear to auscultation bilaterally.  HEART: Regular rate and rhythm. ABDOMEN: Soft, nontender, gravid. No organomegaly. EXTREMITIES: No cyanosis, clubbing, or edema, 2+ distal pulses.  FHT: baseline 130, min-mod variability, no accels, no decels TOCO: no contractions  Disposition: home There are no questions and answers to display.         Allergies as of 03/10/2023       Reactions   Flax Seed Oil [flax Seed Oil] Swelling   blisters   Red Dye #40 (allura Red) Nausea And Vomiting   Codeine Nausea And Vomiting, Other (See Comments)   Blisters as a child- tolerates percocet and other pain medications without issue        Medication List     STOP  taking these medications    buprenorphine -naloxone  8-2 mg Subl SL tablet Commonly known as: SUBOXONE    ibuprofen  600 MG tablet Commonly known as: ADVIL    loperamide  2 MG capsule Commonly known as: IMODIUM        TAKE these medications    prenatal multivitamin Tabs tablet Take 1 tablet by mouth daily at 12 noon.   promethazine  25 MG suppository Commonly known as: PHENERGAN  Place 1 suppository (25 mg total) rectally every 6 (six) hours as needed for nausea or vomiting.         Signed: Tynasia Mccaul 03/10/2023, 12:15 PM

## 2023-03-11 DIAGNOSIS — F1120 Opioid dependence, uncomplicated: Secondary | ICD-10-CM | POA: Insufficient documentation

## 2023-03-11 DIAGNOSIS — F1990 Other psychoactive substance use, unspecified, uncomplicated: Secondary | ICD-10-CM | POA: Insufficient documentation

## 2023-03-11 DIAGNOSIS — O23593 Infection of other part of genital tract in pregnancy, third trimester: Secondary | ICD-10-CM | POA: Insufficient documentation

## 2023-03-12 LAB — CULTURE, OB URINE

## 2023-03-12 LAB — URINE CULTURE, OB REFLEX

## 2023-03-15 LAB — TOXASSURE SELECT 13 (MW), URINE

## 2023-03-17 ENCOUNTER — Encounter: Payer: 59 | Admitting: Family Medicine

## 2023-03-24 ENCOUNTER — Encounter: Payer: 59 | Admitting: Family Medicine

## 2023-03-25 ENCOUNTER — Other Ambulatory Visit: Payer: Self-pay

## 2023-03-25 ENCOUNTER — Encounter (HOSPITAL_COMMUNITY): Payer: Self-pay | Admitting: Obstetrics and Gynecology

## 2023-03-25 ENCOUNTER — Inpatient Hospital Stay (HOSPITAL_COMMUNITY)
Admission: AD | Admit: 2023-03-25 | Discharge: 2023-03-30 | DRG: 832 | Discharge status: Against Medical Advice | Payer: 59 | Attending: Obstetrics and Gynecology | Admitting: Obstetrics and Gynecology

## 2023-03-25 DIAGNOSIS — F1123 Opioid dependence with withdrawal: Secondary | ICD-10-CM | POA: Diagnosis present

## 2023-03-25 DIAGNOSIS — B9689 Other specified bacterial agents as the cause of diseases classified elsewhere: Secondary | ICD-10-CM | POA: Diagnosis present

## 2023-03-25 DIAGNOSIS — O99333 Smoking (tobacco) complicating pregnancy, third trimester: Secondary | ICD-10-CM | POA: Diagnosis present

## 2023-03-25 DIAGNOSIS — O99323 Drug use complicating pregnancy, third trimester: Principal | ICD-10-CM | POA: Diagnosis present

## 2023-03-25 DIAGNOSIS — O23593 Infection of other part of genital tract in pregnancy, third trimester: Secondary | ICD-10-CM | POA: Diagnosis present

## 2023-03-25 DIAGNOSIS — F1193 Opioid use, unspecified with withdrawal: Principal | ICD-10-CM

## 2023-03-25 DIAGNOSIS — F17210 Nicotine dependence, cigarettes, uncomplicated: Secondary | ICD-10-CM | POA: Diagnosis present

## 2023-03-25 DIAGNOSIS — Z79899 Other long term (current) drug therapy: Secondary | ICD-10-CM | POA: Diagnosis not present

## 2023-03-25 DIAGNOSIS — Z5982 Transportation insecurity: Secondary | ICD-10-CM | POA: Diagnosis not present

## 2023-03-25 DIAGNOSIS — O0990 Supervision of high risk pregnancy, unspecified, unspecified trimester: Secondary | ICD-10-CM

## 2023-03-25 DIAGNOSIS — F1490 Cocaine use, unspecified, uncomplicated: Secondary | ICD-10-CM | POA: Diagnosis present

## 2023-03-25 DIAGNOSIS — Z3A28 28 weeks gestation of pregnancy: Secondary | ICD-10-CM

## 2023-03-25 DIAGNOSIS — Z560 Unemployment, unspecified: Secondary | ICD-10-CM | POA: Diagnosis not present

## 2023-03-25 DIAGNOSIS — F1120 Opioid dependence, uncomplicated: Secondary | ICD-10-CM | POA: Diagnosis not present

## 2023-03-25 DIAGNOSIS — O26892 Other specified pregnancy related conditions, second trimester: Secondary | ICD-10-CM | POA: Diagnosis not present

## 2023-03-25 DIAGNOSIS — F1110 Opioid abuse, uncomplicated: Secondary | ICD-10-CM | POA: Diagnosis present

## 2023-03-25 DIAGNOSIS — Z5941 Food insecurity: Secondary | ICD-10-CM

## 2023-03-25 DIAGNOSIS — F1190 Opioid use, unspecified, uncomplicated: Secondary | ICD-10-CM | POA: Diagnosis present

## 2023-03-25 DIAGNOSIS — Z813 Family history of other psychoactive substance abuse and dependence: Secondary | ICD-10-CM

## 2023-03-25 DIAGNOSIS — O26893 Other specified pregnancy related conditions, third trimester: Secondary | ICD-10-CM | POA: Diagnosis not present

## 2023-03-25 DIAGNOSIS — O99320 Drug use complicating pregnancy, unspecified trimester: Secondary | ICD-10-CM | POA: Diagnosis present

## 2023-03-25 DIAGNOSIS — F1990 Other psychoactive substance use, unspecified, uncomplicated: Secondary | ICD-10-CM

## 2023-03-25 LAB — COMPREHENSIVE METABOLIC PANEL
ALT: 11 U/L (ref 0–44)
AST: 18 U/L (ref 15–41)
Albumin: 2.6 g/dL — ABNORMAL LOW (ref 3.5–5.0)
Alkaline Phosphatase: 60 U/L (ref 38–126)
Anion gap: 8 (ref 5–15)
BUN: 5 mg/dL — ABNORMAL LOW (ref 6–20)
CO2: 28 mmol/L (ref 22–32)
Calcium: 8.8 mg/dL — ABNORMAL LOW (ref 8.9–10.3)
Chloride: 103 mmol/L (ref 98–111)
Creatinine, Ser: 0.51 mg/dL (ref 0.44–1.00)
GFR, Estimated: >60 mL/min (ref >60)
Glucose, Bld: 102 mg/dL — ABNORMAL HIGH (ref 70–99)
Potassium: 3.3 mmol/L — ABNORMAL LOW (ref 3.5–5.1)
Sodium: 139 mmol/L (ref 135–145)
Total Bilirubin: 0.4 mg/dL (ref 0.0–1.2)
Total Protein: 5.6 g/dL — ABNORMAL LOW (ref 6.5–8.1)

## 2023-03-25 LAB — TYPE AND SCREEN
ABO/RH(D): A POS
Antibody Screen: NEGATIVE

## 2023-03-25 LAB — CBC
HCT: 32 % — ABNORMAL LOW (ref 36.0–46.0)
Hemoglobin: 10.1 g/dL — ABNORMAL LOW (ref 12.0–15.0)
MCH: 31.1 pg (ref 26.0–34.0)
MCHC: 31.6 g/dL (ref 30.0–36.0)
MCV: 98.5 fL (ref 80.0–100.0)
Platelets: 212 10*3/uL (ref 150–400)
RBC: 3.25 MIL/uL — ABNORMAL LOW (ref 3.87–5.11)
RDW: 13.8 % (ref 11.5–15.5)
WBC: 8.5 10*3/uL (ref 4.0–10.5)
nRBC: 0 % (ref 0.0–0.2)

## 2023-03-25 LAB — RAPID URINE DRUG SCREEN, HOSP PERFORMED
Amphetamines: NOT DETECTED
Barbiturates: NOT DETECTED
Benzodiazepines: NOT DETECTED
Cocaine: POSITIVE — AB
Opiates: NOT DETECTED
Tetrahydrocannabinol: NOT DETECTED

## 2023-03-25 LAB — WET PREP, GENITAL
Sperm: NONE SEEN
Trich, Wet Prep: NONE SEEN
WBC, Wet Prep HPF POC: >=10 — AB (ref <10)
Yeast Wet Prep HPF POC: NONE SEEN

## 2023-03-25 LAB — URINALYSIS, ROUTINE W REFLEX MICROSCOPIC
Bilirubin Urine: NEGATIVE
Glucose, UA: NEGATIVE mg/dL
Hgb urine dipstick: NEGATIVE
Ketones, ur: NEGATIVE mg/dL
Leukocytes,Ua: NEGATIVE
Nitrite: NEGATIVE
Protein, ur: NEGATIVE mg/dL
Specific Gravity, Urine: 1.019 (ref 1.005–1.030)
pH: 7 (ref 5.0–8.0)

## 2023-03-25 LAB — HEPATITIS A ANTIBODY, TOTAL: hep A Total Ab: REACTIVE — AB

## 2023-03-25 LAB — HEPATITIS C ANTIBODY: HCV Ab: NEGATIVE

## 2023-03-25 MED ORDER — LOPERAMIDE HCL 2 MG PO CAPS: 2.0000 mg | ORAL_CAPSULE | ORAL | Status: DC | PRN

## 2023-03-25 MED ORDER — DICYCLOMINE HCL 10 MG PO CAPS: 20.0000 mg | ORAL_CAPSULE | Freq: Four times a day (QID) | ORAL | Status: DC | PRN

## 2023-03-25 MED ORDER — BUPRENORPHINE HCL 8 MG SL SUBL: 8.0000 mg | SUBLINGUAL_TABLET | Freq: Three times a day (TID) | SUBLINGUAL | Status: DC

## 2023-03-25 MED ORDER — CLONIDINE HCL 0.1 MG PO TABS
0.1000 mg | ORAL_TABLET | Freq: Two times a day (BID) | ORAL | Status: DC | PRN
Start: 2023-03-25 — End: 2023-03-30
  Administered 2023-03-26 – 2023-03-28 (×2): 0.1 mg via ORAL
  Filled 2023-03-25 (×3): qty 1

## 2023-03-25 MED ORDER — LACTATED RINGERS IV SOLN: 125.0000 mL/h | INTRAVENOUS | Status: AC

## 2023-03-25 MED ORDER — HYDROXYZINE HCL 50 MG PO TABS
25.0000 mg | ORAL_TABLET | Freq: Four times a day (QID) | ORAL | Status: DC | PRN
Start: 2023-03-25 — End: 2023-03-30
  Administered 2023-03-26 – 2023-03-30 (×8): 25 mg via ORAL
  Filled 2023-03-25 (×8): qty 1

## 2023-03-25 MED ORDER — ONDANSETRON 4 MG PO TBDP
4.0000 mg | ORAL_TABLET | Freq: Four times a day (QID) | ORAL | Status: DC | PRN
  Administered 2023-03-26 – 2023-03-27 (×2): 4 mg via ORAL
  Filled 2023-03-25 (×2): qty 1

## 2023-03-25 MED ORDER — BETAMETHASONE SOD PHOS & ACET 6 (3-3) MG/ML IJ SUSP
12.0000 mg | INTRAMUSCULAR | Status: AC
  Administered 2023-03-25 – 2023-03-26 (×2): 12 mg via INTRAMUSCULAR
  Filled 2023-03-25: qty 5

## 2023-03-25 MED ORDER — CYCLOBENZAPRINE HCL 10 MG PO TABS
10.0000 mg | ORAL_TABLET | Freq: Three times a day (TID) | ORAL | Status: DC | PRN
  Administered 2023-03-27 – 2023-03-30 (×4): 10 mg via ORAL
  Filled 2023-03-25 (×4): qty 1

## 2023-03-25 MED ORDER — DOCUSATE SODIUM 100 MG PO CAPS
100.0000 mg | ORAL_CAPSULE | Freq: Every day | ORAL | Status: DC
  Administered 2023-03-26 – 2023-03-30 (×4): 100 mg via ORAL
  Filled 2023-03-25 (×5): qty 1

## 2023-03-25 MED ORDER — ZOLPIDEM TARTRATE 5 MG PO TABS: 5.0000 mg | ORAL_TABLET | Freq: Every evening | ORAL | Status: DC | PRN

## 2023-03-25 MED ORDER — CALCIUM CARBONATE ANTACID 500 MG PO CHEW
2.0000 | CHEWABLE_TABLET | ORAL | Status: DC | PRN
  Administered 2023-03-27: 400 mg via ORAL
  Filled 2023-03-25: qty 2

## 2023-03-25 MED ORDER — PRENATAL MULTIVITAMIN CH
1.0000 | ORAL_TABLET | Freq: Every day | ORAL | Status: DC
  Administered 2023-03-26 – 2023-03-30 (×5): 1 via ORAL
  Filled 2023-03-25 (×5): qty 1

## 2023-03-25 MED ORDER — METRONIDAZOLE 500 MG PO TABS
500.0000 mg | ORAL_TABLET | Freq: Two times a day (BID) | ORAL | Status: DC
  Administered 2023-03-26 – 2023-03-30 (×10): 500 mg via ORAL
  Filled 2023-03-25 (×10): qty 1

## 2023-03-25 MED ORDER — BUPRENORPHINE HCL 8 MG SL SUBL
8.0000 mg | SUBLINGUAL_TABLET | Freq: Three times a day (TID) | SUBLINGUAL | Status: DC
Start: 2023-03-27 — End: 2023-03-30
  Administered 2023-03-27 – 2023-03-29 (×4): 8 mg via SUBLINGUAL
  Filled 2023-03-25 (×8): qty 1

## 2023-03-25 MED ORDER — ACETAMINOPHEN 500 MG PO TABS
1000.0000 mg | ORAL_TABLET | Freq: Four times a day (QID) | ORAL | Status: DC | PRN
Start: 2023-03-25 — End: 2023-03-30
  Administered 2023-03-29: 1000 mg via ORAL
  Filled 2023-03-25: qty 2

## 2023-03-25 MED ORDER — ACETAMINOPHEN 325 MG PO TABS: 650.0000 mg | ORAL_TABLET | ORAL | Status: DC | PRN

## 2023-03-25 NOTE — MAU Note (Signed)
...  Laura Callahan is a 23 y.o. at [redacted]w[redacted]d here in MAU reporting: She desires to come in to medically detox from Fentanyl. She reports she is also experiencing intermittent lower abdominal cramping. She reports using 500 mg of Fentanyl each day. Not taking her Subutex. Denies VB or LOF. +FM.  Previously discharged from [facility] on 1/9. Per note in epic: Hospital Course: Patient admitted at [redacted] weeks gestation with opiate use disorder with opiate withdrawal. Patient was started on Subutex on 03/08/23. Patient remained well on a dosing of 8 mg every 8 hours. Patient found stable for discharge with plans to follow up for new ob care this afternoon. Patient was informed that her suboxone prescription will be provided at the time of her new ob appointment. Patient verbalized understanding and all questions were answered. Patient passed 1 hour glucola prior to discharge.  Missed her 1/16 and 1/23 appointments.  Lab orders placed from triage: UA

## 2023-03-25 NOTE — MAU Provider Note (Signed)
MAU Provider Note  Chief Complaint: Abdominal Cramping and detox  SUBJECTIVE HPI: Laura Callahan is a 23 y.o. [id number] at [redacted]w[redacted]d by early ultrasound who presents to maternity admissions reporting desire for detox. Pregnancy c/b fentanyl use, bipolar I, anemia, late start to care. Receives [facility] with [facility].  Patient notes relapsing on fentanyl. Currently snorting 0.5g per day. She was admitted to [facility] 1/5-1/9 for detox and start on subutex. She notes that she did not feel well on the subutex and started using fentanyl again. She expresses a desire to quit at this time. She does not want to use subutex again and would rather detox without any subutex/methadone/etc. Last fentanyl use 5:30.  +FM. No VB, LOF, urinary symptoms. Feeling mild cramping low pelvis.  HPI  Past Medical History:  Diagnosis Date   Asthma    Attention deficit    Bipolar 1 disorder (HCC)    Depression    Headache(784.0)    Past Surgical History:  Procedure Laterality Date   addenoidectomy     TONSILLECTOMY     Social History   Socioeconomic History   Marital status: Single    Spouse name: Not on file   Number of children: Not on file   Years of education: Not on file   Highest education level: Not on file  Occupational History   Occupation: Consulting civil engineer    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 [employer]  Tobacco Use   Smoking status: Every Day    Current packs/day: 0.00    Types: Cigarettes    Last attempt to quit: 12/02/2017    Years since quitting: 5.3   Smokeless tobacco: Never  Vaping Use   Vaping status: Never Used  Substance and Sexual Activity   Alcohol use: Not Currently   Drug use: Yes    Types: Fentanyl    Comment: currently smoking daily to help with nausea   Sexual activity: Yes    Partners: Male    Birth control/protection: None    Comment: a week ago  Other Topics Concern   Not on file  Social History Narrative   Lives with mom, currently unemployed   Social Drivers of Corporate investment banker  Strain: Not on file  Food Insecurity: Food Insecurity Present (03/25/2023)   Hunger Vital Sign    Worried About Running Out of Food in the Last Year: Sometimes true    Ran Out of Food in the Last Year: Sometimes true  Transportation Needs: Unmet Transportation Needs (03/25/2023)   PRAPARE - Administrator, Civil Service (Medical): Yes    Lack of Transportation (Non-Medical): Yes  Physical Activity: Not on file  Stress: Not on file  Social Connections: Not on file  Intimate Partner Violence: Not At Risk (03/25/2023)   Humiliation, Afraid, Rape, and Kick questionnaire    Fear of Current or Ex-Partner: No    Emotionally Abused: No    Physically Abused: No    Sexually Abused: No   No current facility-administered medications on file prior to encounter.   Current Outpatient Medications on File Prior to Encounter  Medication Sig Dispense Refill   dicyclomine (BENTYL) 10 MG capsule Take 1 capsule (10 mg total) by mouth 3 (three) times daily before meals. 90 capsule 2   omeprazole (PRILOSEC OTC) 20 MG tablet Take 1 tablet (20 mg total) by mouth in the morning and at bedtime. 60 tablet 3   Prenatal Vit-Fe Fumarate-FA (PRENATAL MULTIVITAMIN) TABS tablet Take 1 tablet by mouth daily at  12 noon.     promethazine (PHENERGAN) 25 MG tablet Take 1 tablet (25 mg total) by mouth every 6 (six) hours as needed for nausea or vomiting. 60 tablet 3   buprenorphine (SUBUTEX) 8 MG SUBL SL tablet Place 1 tablet (8 mg total) under the tongue in the morning, at noon, and at bedtime. 90 tablet 0   traZODone (DESYREL) 50 MG tablet Take 1 tablet (50 mg total) by mouth at bedtime. 90 tablet 3   Allergies  Allergen Reactions   Flax Seed Oil [Flax Seed Oil] Swelling    blisters   Red Dye #40 (Allura Red) Nausea And Vomiting   Codeine Nausea And Vomiting and Other (See Comments)    Blisters as a child- tolerates percocet and other pain medications without issue    ROS:  Pertinent positives/negatives  listed above.  I have reviewed patient's Past Medical Hx, Surgical Hx, Family Hx, Social Hx, medications and allergies.   Physical Exam  Patient Vitals for the past 24 hrs:  BP Temp Temp src Pulse Resp SpO2 Height  03/25/23 1744 -- -- -- -- -- -- 5\' 6"  (1.676 m)  03/25/23 1743 115/63 98.2 F (36.8 C) Oral 64 18 100 % --   Constitutional: Well-developed, well-nourished female in no acute distress  Cardiovascular: normal rate Respiratory: normal effort GI: Abd soft, non-tender MS: Extremities nontender, no edema, normal ROM Neurologic: Alert and oriented x 4  GU: Neg CVAT  FHT:  Baseline 125, moderate variability, 10x10 accelerations present, no decelerations Contractions: none  LAB RESULTS Results for orders placed or performed during the hospital encounter of 03/25/23 (from the past 24 hours)  Urinalysis, Routine w reflex microscopic -Urine, Clean Catch     Status: Abnormal   Collection Time: 03/25/23  5:16 PM  Result Value Ref Range   Color, Urine YELLOW YELLOW   APPearance CLOUDY (A) CLEAR   Specific Gravity, Urine 1.019 1.005 - 1.030   pH 7.0 5.0 - 8.0   Glucose, UA NEGATIVE NEGATIVE mg/dL   Hgb urine dipstick NEGATIVE NEGATIVE   Bilirubin Urine NEGATIVE NEGATIVE   Ketones, ur NEGATIVE NEGATIVE mg/dL   Protein, ur NEGATIVE NEGATIVE mg/dL   Nitrite NEGATIVE NEGATIVE   Leukocytes,Ua NEGATIVE NEGATIVE  Rapid urine drug screen (hospital performed)     Status: Abnormal   Collection Time: 03/25/23  5:30 PM  Result Value Ref Range   Opiates NONE DETECTED NONE DETECTED   Cocaine POSITIVE (A) NONE DETECTED   Benzodiazepines NONE DETECTED NONE DETECTED   Amphetamines NONE DETECTED NONE DETECTED   Tetrahydrocannabinol NONE DETECTED NONE DETECTED   Barbiturates NONE DETECTED NONE DETECTED  Wet prep, genital     Status: Abnormal   Collection Time: 03/25/23  6:01 PM  Result Value Ref Range   Yeast Wet Prep HPF POC NONE SEEN NONE SEEN   Trich, Wet Prep NONE SEEN NONE SEEN    Clue Cells Wet Prep HPF POC PRESENT (A) NONE SEEN   WBC, Wet Prep HPF POC >=10 (A) <10   Sperm NONE SEEN     --/--/A POS (01/05 2201)  IMAGING [place] MFM OB COMP + 14 WK Result Date: 03/07/2023 ----------------------------------------------------------------------  OBSTETRICS REPORT                       (Signed Final 03/07/2023 01:06 pm) ---------------------------------------------------------------------- Patient Info  ID #:       [id number]  D.O.B.:  2001/02/15 (22 yrs)(F)  Name:       FOLASHADE GAMBOA Mackintosh                 Visit Date: 03/07/2023 08:07 am ---------------------------------------------------------------------- Performed By  Attending:        Noralee Space MD        Referred By:      [facility]                                                             Care  Performed By:     Percell Boston          [facility] ---------------------------------------------------------------------- Orders  #  Description                           Code        Ordered By  1  [place] MFM OB COMP + 14 WK                76805.01    TANYA PRATT ----------------------------------------------------------------------  #  Order #                     Accession #                Episode #  1  [id number]                   [id number]                 [id number] ---------------------------------------------------------------------- Indications  [redacted] weeks gestation of pregnancy                Z3A.25  Drug use complicating pregnancy, second        O99.322  trimester  Encounter for antenatal screening,             Z36.9  unspecified ---------------------------------------------------------------------- Fetal Evaluation  Num Of Fetuses:         1  Fetal Heart Rate(bpm):  170  Cardiac Activity:       Observed  Presentation:           Transverse, head to maternal left  Placenta:               Posterior  P. Cord Insertion:       Visualized, central  Amniotic Fluid  AFI FV:      Within normal limits                              Largest Pocket(cm)                              3.8 ---------------------------------------------------------------------- Biometry  BPD:      61.1  mm  G. Age:  24w 6d         15  %    CI:        72.19   %    70 - 86                                                          FL/HC:      19.5   %    18.6 - 20.4  HC:      228.8  mm     G. Age:  24w 6d          8  %    HC/AC:      1.09        1.04 - 1.22  AC:       210   mm     G. Age:  25w 4d         35  %    FL/BPD:     73.0   %    71 - 87  FL:       44.6  mm     G. Age:  24w 5d         11  %    FL/AC:      21.2   %    20 - 24  HUM:      40.6  mm     G. Age:  24w 5d         18  %  CER:      26.8  mm     G. Age:  24w 0d         12  %  Est. FW:     775  gm    1 lb 11 oz      18  % ---------------------------------------------------------------------- OB History  Gravidity:    5         Term:   1        Prem:   1        SAB:   2  TOP:          0       Ectopic:  0        Living: 2 ---------------------------------------------------------------------- Gestational Age  U/S Today:     25w 0d                                        EDD:   06/20/23  Best:          25w 5d     Det. ByMarcella Dubs         EDD:   06/15/23                                      (11/21/22) ---------------------------------------------------------------------- Anatomy  Cranium:               Appears normal         LVOT:                   Appears normal  Cavum:  Appears normal         Aortic Arch:            Not well visualized  Ventricles:            Appears normal         Ductal Arch:            Not well visualized  Choroid Plexus:        Appears normal         Diaphragm:              Appears normal  Cerebellum:            Appears normal         Stomach:                Appears normal, left                                                                        sided  Posterior  Fossa:       Appears normal         Abdomen:                Appears normal  Nuchal Fold:           Not applicable (>20    Abdominal Wall:         Appears nml (cord                         wks GA)                                        insert, abd wall)  Face:                  Appears normal         Cord Vessels:           Appears normal (3                         (orbits and profile)                           vessel cord)  Lips:                  Appears normal         Kidneys:                Appear normal  Palate:                Appears normal         Bladder:                Appears normal  Thoracic:              Appears normal         Spine:                  Not well visualized  Heart:  Appears normal         Upper Extremities:      Visualized                         (4CH, axis, and                         situs)  RVOT:                  Not well visualized    Lower Extremities:      Visualized  Other:  Fetus appears to be a female. Technically difficult due to advanced GA          and fetal position. ---------------------------------------------------------------------- Cervix Uterus Adnexa  Cervix  Normal appearance by transabdominal scan  Uterus  No abnormality visualized.  Right Ovary  Not visualized.  Left Ovary  Not visualized.  Cul De Sac  No free fluid seen.  Adnexa  No abnormality visualized ---------------------------------------------------------------------- Impression  Substance use complicating pregnancy with withdrawal  symptoms.  We performed a fetal anatomical survey.  Amniotic fluid is  normal and good fetal activity seen.  No obvious fetal  structural defects are seen.  Fetal biometry is consistent with  the previously established dates. ---------------------------------------------------------------------- Recommendations  -Recommend completion of fetal anatomy in 4 weeks. ----------------------------------------------------------------------                 Noralee Space, MD  Electronically Signed Final Report   03/07/2023 01:06 pm ----------------------------------------------------------------------    MAU Management/MDM: Orders Placed This Encounter  Procedures   Wet prep, genital   Urinalysis, Routine w reflex microscopic -Urine, Clean Catch   Rapid urine drug screen (hospital performed)    No orders of the defined types were placed in this encounter.    Available prenatal records reviewed.  Patient presents for cramping and desire for detox at 28.2 weeks. Pregnancy c/b known opioid use, late start to care.  #Cramping: no contractions on toco and presentation inconsistent with contractions. UTI ruled-out. Does have BV. Will treat with metrogel.  #OUD/cocaine use during pregnancy: Relapsed on subutex. Conferred with attending, Dr. Macon Large, for next steps. She will come assess patient at bedside.  ASSESSMENT 1. Opiate withdrawal (HCC)   2. Supervision of high risk pregnancy, antepartum   3. Buprenorphine maintenance treatment affecting pregnancy in third trimester (HCC)   4. Polysubstance use disorder   5. [redacted] weeks gestation of pregnancy     PLAN Admit to [facility] for fentanyl withdrawal.  Wylene Simmer, MD OB Fellow 03/25/2023  7:02 PM

## 2023-03-25 NOTE — H&P (Signed)
Laura Callahan is an 23 y.o. [phone] female at [redacted]w[redacted]d here today with request for detox from Fentanyl.    Chief Complaint: Opiate use disorder  HPI: Patient was admitted from 1/5 -1/9 for same complaint, was started on Subutex and discharged on Suboxone.  Reports that Subutex precipitated withdrawal symptoms during her admission, she felt this was started too early.  This is why she immediately went back to using Fentanyl after discharge, did not use Suboxone at all. Wants to restart Subutex around 48 hours after Fentanyl use (last dose around 1730 today 03/25/23).  Reported using Fentanyl 500 mg daily.  Also reported using cocaine about 2-3 days ago.  Of note, she missed her 1/16 and 1/23 [facility] clinic appointments, because she says "I was still using Fentanyl, I did not want to go".  Denies any VB, LOF, contractions. Reports good FM.  Past Medical History:  Diagnosis Date   Asthma    Attention deficit    Bipolar 1 disorder (HCC)    Depression    Headache(784.0)     Past Surgical History:  Procedure Laterality Date   addenoidectomy     TONSILLECTOMY      Family History  Problem Relation Age of Onset   Depression Sister    Depression Mother    Anxiety disorder Mother    Drug abuse Mother    Anxiety disorder Sister    Drug abuse Cousin    Social History:  reports that she has been smoking cigarettes. She has never used smokeless tobacco. She reports that she does not currently use alcohol. She reports current drug use. Drug: Fentanyl.  Allergies:  Allergies  Allergen Reactions   Flax Seed Oil [Flax Seed Oil] Swelling    blisters   Red Dye #40 (Allura Red) Nausea And Vomiting   Codeine Nausea And Vomiting and Other (See Comments)    Blisters as a child- tolerates percocet and other pain medications without issue    Medications Prior to Admission  Medication Sig Dispense Refill   dicyclomine (BENTYL) 10 MG capsule Take 1 capsule (10 mg total) by mouth 3 (three) times daily  before meals. 90 capsule 2   omeprazole (PRILOSEC OTC) 20 MG tablet Take 1 tablet (20 mg total) by mouth in the morning and at bedtime. 60 tablet 3   Prenatal Vit-Fe Fumarate-FA (PRENATAL MULTIVITAMIN) TABS tablet Take 1 tablet by mouth daily at 12 noon.     promethazine (PHENERGAN) 25 MG tablet Take 1 tablet (25 mg total) by mouth every 6 (six) hours as needed for nausea or vomiting. 60 tablet 3   buprenorphine (SUBUTEX) 8 MG SUBL SL tablet Place 1 tablet (8 mg total) under the tongue in the morning, at noon, and at bedtime. 90 tablet 0   traZODone (DESYREL) 50 MG tablet Take 1 tablet (50 mg total) by mouth at bedtime. 90 tablet 3    A comprehensive review of systems was negative.  Blood pressure 115/63, pulse 64, temperature 98.2 F (36.8 C), temperature source Oral, resp. rate 18, height 5\' 6"  (1.676 m), SpO2 100%, unknown if currently breastfeeding.  General appearance: alert, cooperative, and appears stated age Head: Normocephalic, without obvious abnormality, atraumatic Neck: supple, symmetrical, trachea midline Lungs:  normal effort Heart: regular rate and rhythm Abdomen: soft, non-tender; bowel sounds normal; no masses,  no organomegaly Extremities: extremities normal, atraumatic, no cyanosis or edema Skin: Skin color, texture, turgor normal. No rashes or lesions Neurologic: Grossly normal  NST:  Baseline: 140 bpm, Variability: Moderate (  6-25 bpm), Accelerations: Reactive, and Decelerations: Absent  Results for orders placed or performed during the hospital encounter of 03/25/23 (from the past 24 hours)  Urinalysis, Routine w reflex microscopic -Urine, Clean Catch     Status: Abnormal   Collection Time: 03/25/23  5:16 PM  Result Value Ref Range   Color, Urine YELLOW YELLOW   APPearance CLOUDY (A) CLEAR   Specific Gravity, Urine 1.019 1.005 - 1.030   pH 7.0 5.0 - 8.0   Glucose, UA NEGATIVE NEGATIVE mg/dL   Hgb urine dipstick NEGATIVE NEGATIVE   Bilirubin Urine NEGATIVE  NEGATIVE   Ketones, ur NEGATIVE NEGATIVE mg/dL   Protein, ur NEGATIVE NEGATIVE mg/dL   Nitrite NEGATIVE NEGATIVE   Leukocytes,Ua NEGATIVE NEGATIVE  Rapid urine drug screen (hospital performed)     Status: Abnormal   Collection Time: 03/25/23  5:30 PM  Result Value Ref Range   Opiates NONE DETECTED NONE DETECTED   Cocaine POSITIVE (A) NONE DETECTED   Benzodiazepines NONE DETECTED NONE DETECTED   Amphetamines NONE DETECTED NONE DETECTED   Tetrahydrocannabinol NONE DETECTED NONE DETECTED   Barbiturates NONE DETECTED NONE DETECTED  Wet prep, genital     Status: Abnormal   Collection Time: 03/25/23  6:01 PM  Result Value Ref Range   Yeast Wet Prep HPF POC NONE SEEN NONE SEEN   Trich, Wet Prep NONE SEEN NONE SEEN   Clue Cells Wet Prep HPF POC PRESENT (A) NONE SEEN   WBC, Wet Prep HPF POC >=10 (A) <10   Sperm NONE SEEN     Assessment/Plan Principal Problem:   Opioid abuse (HCC) Active Problems:   Supervision of high risk pregnancy, antepartum   Bacteria vaginitis affecting pregnancy in third trimester, antepartum   Cocaine use complicating pregnancy   [redacted] weeks gestation of pregnancy  Admit to [facility] COWS scores Clonidine, Bentyl, Imodium, Zofran, Vistaril. Metronidazole ordered for BV. NST bid, no current PTL/FWB concerns Written to start on Subutex 8 mg SL tid on 03/27/23 around 1600, can start earlier if needed depending on withdrawal symptoms.  Plan was discussed with Dr. Crissie Reese, he plans to see the patient on 03/28/23 Routine antenatal care.   Jaynie Collins, MD, FACOG Obstetrician & Gynecologist, [facility] for [facility], [facility] 03/25/2023, 7:29 PM

## 2023-03-25 NOTE — MAU Note (Signed)
Requisition sent for UDS add on.

## 2023-03-26 DIAGNOSIS — F1190 Opioid use, unspecified, uncomplicated: Secondary | ICD-10-CM | POA: Diagnosis present

## 2023-03-26 DIAGNOSIS — F1110 Opioid abuse, uncomplicated: Secondary | ICD-10-CM

## 2023-03-26 DIAGNOSIS — F1120 Opioid dependence, uncomplicated: Secondary | ICD-10-CM

## 2023-03-26 DIAGNOSIS — O99320 Drug use complicating pregnancy, unspecified trimester: Secondary | ICD-10-CM | POA: Diagnosis present

## 2023-03-26 NOTE — Progress Notes (Signed)
Pt returned to unit at approximately 2142. Vitals stable. Pt w/ no OB complaints. Dr Macon Large and [facility] at bedside to discuss POC. NST complete. IV discontinued.

## 2023-03-26 NOTE — Progress Notes (Signed)
Was informed that patient is still missing. Her belongings are still in the room.  She has been called several times, there is no answer.  Will continue to hope she returns.  Very concerned that she has an active peripheral IV access.     Jaynie Collins, MD

## 2023-03-26 NOTE — Progress Notes (Signed)
FACULTY PRACTICE ANTEPARTUM COMPREHENSIVE PROGRESS NOTE  Laura Callahan is a 23 y.o. [id number] at [redacted]w[redacted]d who is admitted for detox process for OUD.  Estimated Date of Delivery: 06/15/23  Length of Stay:  1 Days. Admitted 03/25/2023  Subjective: Denies any withdrawal or other symptoms. Last few COWS scores have been 0. Patient reports good fetal movement.  She reports no uterine contractions, no bleeding and no loss of fluid per vagina.  Vitals:  Blood pressure 119/61, pulse (!) 58, temperature 97.7 F (36.5 C), temperature source Oral, resp. rate 17, height 5\' 6"  (1.676 m), weight 68.5 kg, SpO2 100%, unknown if currently breastfeeding.  Physical Examination: CONSTITUTIONAL: Well-developed, well-nourished female in no acute distress.  NEUROLOGIC: Alert and oriented to person, place, and time. No cranial nerve deficit noted. PSYCHIATRIC: Normal mood and affect. Normal behavior. Normal judgment and thought content. CARDIOVASCULAR: Normal heart rate noted, regular rhythm RESPIRATORY: Effort and breath sounds normal, no problems with respiration noted MUSCULOSKELETAL: Normal range of motion. No edema and no tenderness. 2+ distal pulses. ABDOMEN: Soft, nontender, nondistended, gravid. CERVIX:  Deferred  Fetal monitoring: FHR: 130 bpm, Variability: moderate, Accelerations: Present, Decelerations: Absent  Uterine activity: No contractions   Results for orders placed or performed during the hospital encounter of 03/25/23 (from the past 48 hours)  Urinalysis, Routine w reflex microscopic -Urine, Clean Catch     Status: Abnormal   Collection Time: 03/25/23  5:16 PM  Result Value Ref Range   Color, Urine YELLOW YELLOW   APPearance CLOUDY (A) CLEAR   Specific Gravity, Urine 1.019 1.005 - 1.030   pH 7.0 5.0 - 8.0   Glucose, UA NEGATIVE NEGATIVE mg/dL   Hgb urine dipstick NEGATIVE NEGATIVE   Bilirubin Urine NEGATIVE NEGATIVE   Ketones, ur NEGATIVE NEGATIVE mg/dL   Protein, ur NEGATIVE NEGATIVE  mg/dL   Nitrite NEGATIVE NEGATIVE   Leukocytes,Ua NEGATIVE NEGATIVE    Comment: Performed at [facility], [address], [place], [place] [address]  Rapid urine drug screen (hospital performed)     Status: Abnormal   Collection Time: 03/25/23  5:30 PM  Result Value Ref Range   Opiates NONE DETECTED NONE DETECTED   Cocaine POSITIVE (A) NONE DETECTED   Benzodiazepines NONE DETECTED NONE DETECTED   Amphetamines NONE DETECTED NONE DETECTED   Tetrahydrocannabinol NONE DETECTED NONE DETECTED   Barbiturates NONE DETECTED NONE DETECTED    Comment: (NOTE) DRUG SCREEN FOR MEDICAL PURPOSES ONLY.  IF CONFIRMATION IS NEEDED FOR ANY PURPOSE, NOTIFY LAB WITHIN 5 DAYS.  LOWEST DETECTABLE LIMITS FOR URINE DRUG SCREEN Drug Class                     Cutoff (ng/mL) Amphetamine and metabolites    1000 Barbiturate and metabolites    200 Benzodiazepine                 200 Opiates and metabolites        300 Cocaine and metabolites        300 THC                            50 Performed at [facility], [address], [place], [place] [address]   Wet prep, genital     Status: Abnormal   Collection Time: 03/25/23  6:01 PM  Result Value Ref Range   Yeast Wet Prep HPF POC NONE SEEN NONE SEEN   Trich, Wet Prep  NONE SEEN NONE SEEN   Clue Cells Wet Prep HPF POC PRESENT (A) NONE SEEN   WBC, Wet Prep HPF POC >=10 (A) <10   Sperm NONE SEEN     Comment: Performed at [facility], [address] [place], [place] [address]  Hepatitis A antibody, total     Status: Abnormal   Collection Time: 03/25/23  7:46 PM  Result Value Ref Range   hep A Total Ab Reactive (A) NON REACTIVE    Comment: Performed at [facility], [address], [place], [place] [address]  Type and screen [facility]     Status: None   Collection Time: 03/25/23  7:46 PM  Result Value Ref Range   ABO/RH(D) A POS    Antibody Screen NEG    Sample Expiration      03/28/2023,2359 Performed at  [facility], [address] [place], [place] [address]   Comprehensive metabolic panel     Status: Abnormal   Collection Time: 03/25/23  7:46 PM  Result Value Ref Range   Sodium 139 135 - 145 mmol/L   Potassium 3.3 (L) 3.5 - 5.1 mmol/L   Chloride 103 98 - 111 mmol/L   CO2 28 22 - 32 mmol/L   Glucose, Bld 102 (H) 70 - 99 mg/dL    Comment: Glucose reference range applies only to samples taken after fasting for at least 8 hours.   BUN 5 (L) 6 - 20 mg/dL   Creatinine, Ser 2.13 0.44 - 1.00 mg/dL   Calcium 8.8 (L) 8.9 - 10.3 mg/dL   Total Protein 5.6 (L) 6.5 - 8.1 g/dL   Albumin 2.6 (L) 3.5 - 5.0 g/dL   AST 18 15 - 41 U/L   ALT 11 0 - 44 U/L   Alkaline Phosphatase 60 38 - 126 U/L   Total Bilirubin 0.4 0.0 - 1.2 mg/dL   GFR, Estimated >08 >65 mL/min    Comment: (NOTE) Calculated using the CKD-EPI Creatinine Equation (2021)    Anion gap 8 5 - 15    Comment: Performed at [facility], [address], [place], [place] [address]  CBC     Status: Abnormal   Collection Time: 03/25/23  7:46 PM  Result Value Ref Range   WBC 8.5 4.0 - 10.5 K/uL   RBC 3.25 (L) 3.87 - 5.11 MIL/uL   Hemoglobin 10.1 (L) 12.0 - 15.0 g/dL   HCT 62.9 (L) 52.8 - 41.3 %   MCV 98.5 80.0 - 100.0 fL   MCH 31.1 26.0 - 34.0 pg   MCHC 31.6 30.0 - 36.0 g/dL   RDW 24.4 01.0 - 27.2 %   Platelets 212 150 - 400 K/uL   nRBC 0.0 0.0 - 0.2 %    Comment: Performed at [facility], [address] [place], [place] [address]    No results found.  Current scheduled medications  betamethasone acetate-betamethasone sodium phosphate  12 mg Intramuscular Q24H   [START ON 03/27/2023] buprenorphine  8 mg Sublingual TID   docusate sodium  100 mg Oral Daily   metroNIDAZOLE  500 mg Oral Q12H   prenatal multivitamin  1 tablet Oral Q1200    I have reviewed the patient's current medications.  ASSESSMENT: Principal Problem:   Opioid abuse (HCC) Active Problems:   Supervision of high risk pregnancy, antepartum    Bacteria vaginitis affecting pregnancy in third trimester, antepartum   Cocaine use complicating pregnancy  [redacted] weeks gestation of pregnancy   Drug use complicating pregnancy   Opioid use disorder   Pregnancy complicated by subutex maintenance, antepartum (HCC)   PLAN: Continue OUD detox process as outlined during admission, medications ordered for symptoms.  Continue checking COWS as ordered. Subutex to be initiated tomorrow or earlier if needed for COWS score >12 Continue Metronidazole for BV No current obstetric concerns, BMZ # 2 given to be given today. Reassuring fetal status. Continue routine antenatal care.   Jaynie Collins, MD, FACOG Obstetrician & Gynecologist, [facility] for [employer], [facility]

## 2023-03-26 NOTE — Progress Notes (Signed)
Pt not present in room upon RN rounding. Unit searched including family room and pt not found. Pt's phone called x3 with no response. Security and [facility] notified.

## 2023-03-26 NOTE — Progress Notes (Addendum)
CSW was consulted due to "Substance Abuse Counseling/Education." CSW met with patient at bedside to assess for needs and provide support. When CSW entered room, patient was observed laying in bed. A visitor was present laying on couch who patient identified as her boyfriend. CSW requested to speak with patient alone, patient provided verbal consent to complete consult with her boyfriend present. CSW explained reason for visit. Patient presented as calm, oriented x4, and welcomed CSW visit.   CSW inquired how patient is feeling. Patient reports she is "okay." CSW assessed for needs. Patient states that she is interested in a 30-day inpatient substance abuse treatment program once discharged from hospital. CSW inquired about previous plan to participate in Medication Assisted Treatment (MAT) therapy (Subutex to Suboxone). patient states that she does not like the way that Subutex makes her feel while she is detoxing, stating that it makes her withdrawal symptoms worse. Patient states her current plan is to detox in the hospital with the possibility of beginning Subutex beginning Monday afternoon to allow herself to begin the withdrawal process prior to taking Subutex. CSW encouraged patient to discuss treatment preferences with her provider to ensure safety of herself and fetus. Patient reports she has been snorting 1 gram of fentanyl per day "off and on" for the past year. Per chart review, MOB tested positive for cocaine on a urine drug screen 03/25/23 and reported using cocaine 2-3 days before current admission.  CSW provided patient with [facility] contact information and additional substance use treatment resources. CSW encouraged patient to follow up with [facility] on Monday, 03/28/23 due to the office being closed on weekends. Patient was also agreeable to mental health resources, which CSW provided.  CSW inquired if pt is experiencing withdrawal symptoms and/or cravings, pt denied. CSW inquired about  coping skills in the case pt begins to experience cravings. Patient stated that she has not found anything that has helped cravings in the past. CSW inquired if pt would like hotline and online meeting resources for 12-step programs. Patient was agreeable to resources, which CSW provided.  CSW inquired about resource follow up since last CSW assessment during her hospitalization 03/10/23. See CSW note on 03/10/23 for additional patient history. Patient states that she has not yet applied for [facility] or food stamps. CSW inquired if patient still has resource information. Patient requested new copies of WIC and SNAP information and food bank resources, which CSW provided.   CSW inquired about current supports. Patient states that she is connected with a case worker, [place] through [facility] and plans to follow up with her regarding housing resources. Patient states she currently lives with her mother but would like to move into her own place after baby is born.    CSW assessed for safety, patient denied current SI/HI. CSW inquired about further needs, patient denied additional needs at this time.   Signed,  Norberto Sorenson, MSW, LCSWA, LCASA 03/26/2023 5:27 PM

## 2023-03-26 NOTE — Plan of Care (Signed)

## 2023-03-27 DIAGNOSIS — Z3A28 28 weeks gestation of pregnancy: Secondary | ICD-10-CM

## 2023-03-27 LAB — HCV RNA QUANT RFLX ULTRA OR GENOTYP
HCV RNA Qnt(log copy/mL): UNDETERMINED {Log}
HepC Qn: NOT DETECTED [IU]/mL

## 2023-03-27 LAB — HEPATITIS B SURFACE ANTIBODY, QUANTITATIVE: Hep B S AB Quant (Post): 9.2 m[IU]/mL — ABNORMAL LOW

## 2023-03-27 NOTE — Progress Notes (Signed)
FACULTY PRACTICE ANTEPARTUM COMPREHENSIVE PROGRESS NOTE  Laura Callahan is a 23 y.o. [id number] at [redacted]w[redacted]d who is admitted for detox process for OUD.  Estimated Date of Delivery: 06/15/23  Length of Stay:  2 Days. Admitted 03/25/2023  Subjective: Reports having some restlessness in her extremities and having some anxiety, current COWS score is 5  Does not want to start Subutex earlier than this evening as she initially planned.   Patient reports good fetal movement.  She reports no uterine contractions, no bleeding and no loss of fluid per vagina.  Vitals:  Blood pressure (!) 106/57, pulse 66, temperature 97.9 F (36.6 C), temperature source Oral, resp. rate 16, height 5\' 6"  (1.676 m), weight 68.5 kg, SpO2 99%, unknown if currently breastfeeding.  Physical Examination: CONSTITUTIONAL: Well-developed, well-nourished female in no acute distress.  NEUROLOGIC: Alert and oriented to person, place, and time. No cranial nerve deficit noted. PSYCHIATRIC: Normal mood and affect. Normal behavior. Normal judgment and thought content. CARDIOVASCULAR: Normal heart rate noted, regular rhythm RESPIRATORY: Effort and breath sounds normal, no problems with respiration noted MUSCULOSKELETAL: Normal range of motion. No edema and no tenderness. 2+ distal pulses. ABDOMEN: Soft, nontender, nondistended, gravid. CERVIX:  Deferred  Fetal monitoring: FHR: 150 bpm, Variability: moderate, Accelerations: Present, Decelerations: Absent  Uterine activity: No contractions   Results for orders placed or performed during the hospital encounter of 03/25/23 (from the past 48 hours)  Urinalysis, Routine w reflex microscopic -Urine, Clean Catch     Status: Abnormal   Collection Time: 03/25/23  5:16 PM  Result Value Ref Range   Color, Urine YELLOW YELLOW   APPearance CLOUDY (A) CLEAR   Specific Gravity, Urine 1.019 1.005 - 1.030   pH 7.0 5.0 - 8.0   Glucose, UA NEGATIVE NEGATIVE mg/dL   Hgb urine dipstick NEGATIVE NEGATIVE    Bilirubin Urine NEGATIVE NEGATIVE   Ketones, ur NEGATIVE NEGATIVE mg/dL   Protein, ur NEGATIVE NEGATIVE mg/dL   Nitrite NEGATIVE NEGATIVE   Leukocytes,Ua NEGATIVE NEGATIVE    Comment: Performed at [facility], [address], [place], [place] [address]  Rapid urine drug screen (hospital performed)     Status: Abnormal   Collection Time: 03/25/23  5:30 PM  Result Value Ref Range   Opiates NONE DETECTED NONE DETECTED   Cocaine POSITIVE (A) NONE DETECTED   Benzodiazepines NONE DETECTED NONE DETECTED   Amphetamines NONE DETECTED NONE DETECTED   Tetrahydrocannabinol NONE DETECTED NONE DETECTED   Barbiturates NONE DETECTED NONE DETECTED    Comment: (NOTE) DRUG SCREEN FOR MEDICAL PURPOSES ONLY.  IF CONFIRMATION IS NEEDED FOR ANY PURPOSE, NOTIFY LAB WITHIN 5 DAYS.  LOWEST DETECTABLE LIMITS FOR URINE DRUG SCREEN Drug Class                     Cutoff (ng/mL) Amphetamine and metabolites    1000 Barbiturate and metabolites    200 Benzodiazepine                 200 Opiates and metabolites        300 Cocaine and metabolites        300 THC                            50 Performed at [facility], [address], [place], [place] [address]   Wet prep, genital     Status: Abnormal   Collection Time: 03/25/23  6:01 PM  Result Value Ref Range   Yeast Wet Prep HPF POC NONE SEEN NONE SEEN   Trich, Wet Prep NONE SEEN NONE SEEN   Clue Cells Wet Prep HPF POC PRESENT (A) NONE SEEN   WBC, Wet Prep HPF POC >=10 (A) <10   Sperm NONE SEEN     Comment: Performed at [facility], [address], [place], [place] [address]  Hepatitis A antibody, total     Status: Abnormal   Collection Time: 03/25/23  7:46 PM  Result Value Ref Range   hep A Total Ab Reactive (A) NON REACTIVE    Comment: Performed at [facility], [address] [place], [place] [address]  Type and screen [facility]     Status: None   Collection Time: 03/25/23  7:46 PM  Result Value Ref  Range   ABO/RH(D) A POS    Antibody Screen NEG    Sample Expiration      03/28/2023,2359 Performed at [facility], [address] [place], [place] [address]   Comprehensive metabolic panel     Status: Abnormal   Collection Time: 03/25/23  7:46 PM  Result Value Ref Range   Sodium 139 135 - 145 mmol/L   Potassium 3.3 (L) 3.5 - 5.1 mmol/L   Chloride 103 98 - 111 mmol/L   CO2 28 22 - 32 mmol/L   Glucose, Bld 102 (H) 70 - 99 mg/dL    Comment: Glucose reference range applies only to samples taken after fasting for at least 8 hours.   BUN 5 (L) 6 - 20 mg/dL   Creatinine, Ser 9.62 0.44 - 1.00 mg/dL   Calcium 8.8 (L) 8.9 - 10.3 mg/dL   Total Protein 5.6 (L) 6.5 - 8.1 g/dL   Albumin 2.6 (L) 3.5 - 5.0 g/dL   AST 18 15 - 41 U/L   ALT 11 0 - 44 U/L   Alkaline Phosphatase 60 38 - 126 U/L   Total Bilirubin 0.4 0.0 - 1.2 mg/dL   GFR, Estimated >95 >28 mL/min    Comment: (NOTE) Calculated using the CKD-EPI Creatinine Equation (2021)    Anion gap 8 5 - 15    Comment: Performed at [facility], [address], [place], [place] [address]  CBC     Status: Abnormal   Collection Time: 03/25/23  7:46 PM  Result Value Ref Range   WBC 8.5 4.0 - 10.5 K/uL   RBC 3.25 (L) 3.87 - 5.11 MIL/uL   Hemoglobin 10.1 (L) 12.0 - 15.0 g/dL   HCT 40.1 (L) 02.7 - 25.3 %   MCV 98.5 80.0 - 100.0 fL   MCH 31.1 26.0 - 34.0 pg   MCHC 31.6 30.0 - 36.0 g/dL   RDW 66.4 40.3 - 47.4 %   Platelets 212 150 - 400 K/uL   nRBC 0.0 0.0 - 0.2 %    Comment: Performed at [facility], [address], [place], [place] [address]    No results found.  Current scheduled medications  buprenorphine  8 mg Sublingual TID   docusate sodium  100 mg Oral Daily   metroNIDAZOLE  500 mg Oral Q12H   prenatal multivitamin  1 tablet Oral Q1200    I have reviewed the patient's current medications.  ASSESSMENT: Principal Problem:   Opioid abuse (HCC) Active Problems:   Supervision of high risk pregnancy,  antepartum   Bacteria vaginitis affecting pregnancy in third trimester, antepartum  Cocaine use complicating pregnancy   [redacted] weeks gestation of pregnancy   Drug use complicating pregnancy   Opioid use disorder   Pregnancy complicated by subutex maintenance, antepartum (HCC)   PLAN: Continue OUD detox process with medications ordered for symptoms.  Subutex to be initiated later today as plannedor earlier if needed, will continue checking COWS as ordered. Continue Metronidazole for BV No current obstetric concerns, s/p BMZ x 2. Reassuring fetal status. Continue routine antenatal care.   Jaynie Collins, MD, FACOG Obstetrician & Gynecologist, [facility] for [employer], [facility]

## 2023-03-27 NOTE — Plan of Care (Signed)

## 2023-03-28 DIAGNOSIS — F1120 Opioid dependence, uncomplicated: Secondary | ICD-10-CM

## 2023-03-28 LAB — GC/CHLAMYDIA PROBE AMP (~~LOC~~) NOT AT ARMC
Chlamydia: NEGATIVE
Comment: NEGATIVE
Comment: NORMAL
Neisseria Gonorrhea: NEGATIVE

## 2023-03-28 NOTE — Progress Notes (Signed)
Social rounds on patient.  She reports she is doing pretty well, first dose of subutex last night and felt like it precipitated some withdrawal. Took second dose about an hour ago and feels anxious but not like she did last night. Discussed peak effect usually occurs within one hour so she is unlikely to have any precipitated symptoms at this point. Discussed hopefully she will be good to go home tomorrow or day after depending on her progress, I will send a message to get her into [facility] for next week.   Venora Maples, MD, MPH, FAAFP Attending Family Medicine Physician, [facility] for [facility], [facility]  5:15 PM 03/28/23

## 2023-03-28 NOTE — Progress Notes (Signed)
FACULTY PRACTICE ANTEPARTUM COMPREHENSIVE PROGRESS NOTE  Laura YARBOROUGH is a 23 y.o. [id number] at [redacted]w[redacted]d who is admitted for detox process for OUD.  Estimated Date of Delivery: 06/15/23  Length of Stay:  3 Days. Admitted 03/25/2023  Subjective: Reports feeling okay, current COWS score is 0. StartedSubutex yesterday evening as she initially planned, but declined second dose because she felt she was very tired.  Willing to have the dose this morning.    Patient reports good fetal movement.  She reports no uterine contractions, no bleeding and no loss of fluid per vagina.  Vitals:  Blood pressure (!) 93/40, pulse (!) 58, temperature 97.9 F (36.6 C), temperature source Oral, resp. rate 16, height 5\' 6"  (1.676 m), weight 68.5 kg, SpO2 99%, unknown if currently breastfeeding.  Physical Examination: CONSTITUTIONAL: Well-developed, well-nourished female in no acute distress.  NEUROLOGIC: Alert and oriented to person, place, and time. No cranial nerve deficit noted. PSYCHIATRIC: Normal mood and affect. Normal behavior. Normal judgment and thought content. CARDIOVASCULAR: Normal heart rate noted, regular rhythm RESPIRATORY: Effort and breath sounds normal, no problems with respiration noted MUSCULOSKELETAL: Normal range of motion. No edema and no tenderness. 2+ distal pulses. ABDOMEN: Soft, nontender, nondistended, gravid. CERVIX:  Deferred  Fetal monitoring: FHR: 130 bpm, Variability: moderate, Accelerations: Present, Decelerations: Absent  Uterine activity: No contractions   No results found for this or any previous visit (from the past 48 hours).   No results found.  Current scheduled medications  buprenorphine  8 mg Sublingual TID   docusate sodium  100 mg Oral Daily   metroNIDAZOLE  500 mg Oral Q12H   prenatal multivitamin  1 tablet Oral Q1200    I have reviewed the patient's current medications.  ASSESSMENT: Principal Problem:   Opioid abuse (HCC) Active Problems:   Supervision  of high risk pregnancy, antepartum   Bacteria vaginitis affecting pregnancy in third trimester, antepartum   Cocaine use complicating pregnancy   [redacted] weeks gestation of pregnancy   Drug use complicating pregnancy   Opioid use disorder   Pregnancy complicated by subutex maintenance, antepartum (HCC)   PLAN: Continue OUD detox process with medications ordered for symptoms.  Continue Subutex as ordered, will continue checking COWS as ordered. Dr Crissie Reese to see patient today, will follow up his recommendations. Continue Metronidazole for BV No current obstetric concerns, s/p BMZ x 2. Reassuring fetal status. Continue routine antenatal care.   Jaynie Collins, MD, FACOG Obstetrician & Gynecologist, [facility] for [employer], [facility]

## 2023-03-29 DIAGNOSIS — O26892 Other specified pregnancy related conditions, second trimester: Secondary | ICD-10-CM

## 2023-03-29 DIAGNOSIS — Z3A28 28 weeks gestation of pregnancy: Secondary | ICD-10-CM

## 2023-03-29 DIAGNOSIS — F1110 Opioid abuse, uncomplicated: Secondary | ICD-10-CM

## 2023-03-29 MED ORDER — BUPRENORPHINE HCL 8 MG SL SUBL
4.0000 mg | SUBLINGUAL_TABLET | Freq: Once | SUBLINGUAL | Status: DC
  Filled 2023-03-29: qty 1

## 2023-03-29 NOTE — Plan of Care (Signed)
  Problem: Education: Goal: Knowledge of disease or condition will improve Outcome: Progressing   Problem: Clinical Measurements: Goal: Complications related to the disease process, condition or treatment will be avoided or minimized Outcome: Progressing   Problem: Health Behavior/Discharge Planning: Goal: Ability to manage health-related needs will improve Outcome: Progressing   Problem: Clinical Measurements: Goal: Ability to maintain clinical measurements within normal limits will improve Outcome: Progressing Goal: Will remain free from infection Outcome: Progressing Goal: Diagnostic test results will improve Outcome: Progressing

## 2023-03-29 NOTE — Progress Notes (Signed)
FACULTY PRACTICE ANTEPARTUM COMPREHENSIVE PROGRESS NOTE  Laura Callahan is a 23 y.o. [id number] at [redacted]w[redacted]d who is admitted for opioid use disorder and initiation of subutex.  Estimated Date of Delivery: 06/15/23 Fetal presentation is unknown.  Length of Stay:  4 Days. Admitted 03/25/2023  Subjective: Feeling some mild withdrawal symptoms today - anxiety, restlessness, muscle aches. Feels like the subutex 8mg  still precipitated some withdrawal-type symptoms yesterday. Called [facility] who is willing to accept her for rehabilitation.   No obstetric complaints. No ctx, VB, LOF. +FM  Vitals:  Blood pressure (!) 105/47, pulse 72, temperature 98 F (36.7 C), temperature source Oral, resp. rate 18, height 5\' 6"  (1.676 m), weight 68.5 kg, SpO2 100%, unknown if currently breastfeeding. Physical Examination: CONSTITUTIONAL: Well-developed, well-nourished female in no acute distress.  NEUROLOGIC: Alert and oriented to person, place, and time.  CARDIOVASCULAR: Normal heart rate noted, RESPIRATORY: Effort normal, no problems with respiration noted. ABDOMEN: Soft, nondistended, gravid. CERVIX:    Fetal monitoring: FHR: 150 bpm, Variability: moderate, Accelerations: Present, Decelerations: Absent  Uterine activity: flat  No results found for this or any previous visit (from the past 48 hours).  No results found.  Current scheduled medications  buprenorphine  4 mg Sublingual Once   buprenorphine  8 mg Sublingual TID   docusate sodium  100 mg Oral Daily   metroNIDAZOLE  500 mg Oral Q12H   prenatal multivitamin  1 tablet Oral Q1200   I have reviewed the patient's current medications.  ASSESSMENT: Principal Problem:   Opioid abuse (HCC) Active Problems:   Supervision of high risk pregnancy, antepartum   Bacteria vaginitis affecting pregnancy in third trimester, antepartum   Cocaine use complicating pregnancy   [redacted] weeks gestation of pregnancy   Drug use complicating pregnancy   Opioid use disorder    Pregnancy complicated by subutex maintenance, antepartum (HCC)   PLAN: Opioid use disorder - Pt concerned about precipitated withdrawal, but currently having mild symptoms. Agreeable to trying subutex 4mg  x 1 now knowing that she can get an additional 4mg  if she tolerates the initial dose but is still having mild symptoms - Working to stabilize patient on final subutex dose. Received 8mg  BID yesterday - Pt reached out to [facility] and reports they are willing to accept her to their rehabilitation program with a referral. They are aware she is pregnant - SW consult for help coordinating care as above - [facility] appt scheduled 2/4  2. FWB - Daily NSTs - S/p BMZ 1/24-25  Continue routine antenatal care  Harvie Bridge, MD Obstetrician & Gynecologist, [facility] for [facility], [facility]

## 2023-03-29 NOTE — Progress Notes (Signed)
CSW followed up with [facility] to inquire about patient's referral. Staff reported that they are ready for patient to complete her prescreen and advised that patient call directly.   CSW followed up with patient at bedside and provided update that patient needs to call [facility] directly to complete the prescreen. Patient verbalized understanding and denied any questions. CSW provided patient with the number to call and complete prescreen.   CSW awaiting patient to complete prescreen.   Celso Sickle, LCSW Clinical Social Worker [facility] Cell#: [phone]

## 2023-03-29 NOTE — Progress Notes (Signed)
CSW contacted [facility] to confirm receipt of referral. [facility] staff reported that the process takes several hours and advised CSW to call back later today.   Celso Sickle, LCSW Clinical Social Worker [facility] Cell#: [phone]

## 2023-03-29 NOTE — Progress Notes (Signed)
CSW acknowledged consult and met with patient at bedside. Patient was accompanied by female guest who identified himself as FOB. CSW introduced self and asked to speak with patient privately, FOB requested to stay in the room; patient did not respond to FOB's request. CSW asked to speak with patient privately, FOB left the room. CSW explained reason for consult. Patient confirmed that she is interested in going to [facility] for residential treatment and signed a release form for CSW to complete referral to [facility]. Patient verbalized that moving forward it is okay for FOB to be in the room as he is aware of everything.   CSW followed up with [facility] and faxed referral. CSW will follow up with [facility] in regards to referral and next steps.   Celso Sickle, LCSW Clinical Social Worker [facility] Cell#: [phone]

## 2023-03-30 DIAGNOSIS — O26893 Other specified pregnancy related conditions, third trimester: Secondary | ICD-10-CM

## 2023-03-30 MED ORDER — NALOXONE HCL 4 MG/0.1ML NA LIQD: NASAL | 11 refills | Status: DC

## 2023-03-30 NOTE — Social Work (Signed)
CSW followed up with [facility] to determine next steps for the patient to be admitted. Staff reported that they are waiting for the patient to complete her prescreen.   CSW went by patient's room 1S08 to follow up and the patient was not in the room. Nursing staff stated that the patient may have gone walking.   RN to message CSW when patient returns.   Vivi Barrack, MSW, LCSW Clinical Social Worker  [phone] 03/30/2023  10:38 AM

## 2023-03-30 NOTE — Social Work (Signed)
Nurse notified CSW that patient left AMA.

## 2023-03-30 NOTE — Progress Notes (Signed)
RN in room to do fetal monitoring. Pt refused and stated she was going to leave AMA. MD made aware.

## 2023-03-30 NOTE — Discharge Summary (Signed)
Antenatal Physician Discharge Summary  Patient ID: Laura Callahan MRN: [id number] DOB/AGE: 2000/10/13 23 y.o.  Admit date: 03/25/2023 Discharge date: 03/30/2023  Admission Diagnoses: Opioid use disorder, moderate  Discharge Diagnoses: same  Prenatal Procedures: NST  Consults: Neonatology, Maternal Fetal Medicine  Hospital Course:  Laura Callahan is a 23 y.o. [id number] with IUP at [redacted]w[redacted]d admitted for opioid use disorder. Of note patient had previously been admitted from 03/06/23 - 03/10/23 for opioid withdrawal and was initiated on subutex at that time. She then re-presented on 03/25/23 reporting relapse and a desire to resume subutex. After allowing withdrawal symptoms to appear she was resumed on subutex but took it intermittently and not scheduled. Social work saw patient and coordinated plan for her to be admitted to [facility] later this week on 04/01/2023.  On day of discharge patient reported she wanted to leave AMA and I went to speak with her. She disclosed that she does not like the subutex and feels that it makes her jittery and anxious. She did acknowledge that she struggles with anxiety in general and that this has not been treated adequately. I reviewed current best practices which show that being on MAT such as methadone or buprenorphine decrease the mortality risk of OUD significantly, and given that she has already had one relapse I would strongly recommend she remain on MAT. We also discussed the evidence that abstinence only approaches to OUD have actually been shown to increase mortality. Ultimately though it is her choice. After discussion she chose to pursue a abstinence only approach. She agreed to close follow up in [facility] clinic tomorrow, followed by [facility] admission the day after tomorrow.   Will send narcan prescription to her pharmacy and follow up tomorrow.   Discharge Exam: Temp:  [98 F (36.7 C)-98.1 F (36.7 C)] 98.1 F (36.7 C) (01/29  0839) Pulse Rate:  [62-79] 62 (01/29 0839) Resp:  [17-18] 17 (01/29 0839) BP: (104-110)/(45-55) 104/55 (01/29 0839) SpO2:  [98 %-100 %] 100 % (01/29 0839) Physical Exam Constitutional:      General: She is not in acute distress.    Appearance: Normal appearance. She is not ill-appearing.  HENT:     Head: Atraumatic.  Eyes:     General: No scleral icterus.    Conjunctiva/sclera: Conjunctivae normal.  Pulmonary:     Effort: Pulmonary effort is normal.  Skin:    General: Skin is warm and dry.     Coloration: Skin is not jaundiced or pale.  Neurological:     Mental Status: She is alert.     Coordination: Coordination normal.  Psychiatric:        Mood and Affect: Mood is anxious.        Speech: Speech normal.        Behavior: Behavior is not agitated or aggressive. Behavior is cooperative.        Thought Content: Thought content normal.      Significant Diagnostic Studies:  Results for orders placed or performed during the hospital encounter of 03/25/23 (from the past week)  Urinalysis, Routine w reflex microscopic -Urine, Clean Catch   Collection Time: 03/25/23  5:16 PM  Result Value Ref Range   Color, Urine YELLOW YELLOW   APPearance CLOUDY (A) CLEAR   Specific Gravity, Urine 1.019 1.005 - 1.030   pH 7.0 5.0 - 8.0   Glucose, UA NEGATIVE NEGATIVE mg/dL   Hgb urine dipstick NEGATIVE NEGATIVE   Bilirubin Urine NEGATIVE NEGATIVE   Ketones,  ur NEGATIVE NEGATIVE mg/dL   Protein, ur NEGATIVE NEGATIVE mg/dL   Nitrite NEGATIVE NEGATIVE   Leukocytes,Ua NEGATIVE NEGATIVE  Rapid urine drug screen (hospital performed)   Collection Time: 03/25/23  5:30 PM  Result Value Ref Range   Opiates NONE DETECTED NONE DETECTED   Cocaine POSITIVE (A) NONE DETECTED   Benzodiazepines NONE DETECTED NONE DETECTED   Amphetamines NONE DETECTED NONE DETECTED   Tetrahydrocannabinol NONE DETECTED NONE DETECTED   Barbiturates NONE DETECTED NONE DETECTED  GC/Chlamydia probe amp ([place])not at  [facility]   Collection Time: 03/25/23  5:47 PM  Result Value Ref Range   Neisseria Gonorrhea Negative    Chlamydia Negative    Comment Normal Reference Ranger Chlamydia - Negative    Comment      Normal Reference Range Neisseria Gonorrhea - Negative  Wet prep, genital   Collection Time: 03/25/23  6:01 PM  Result Value Ref Range   Yeast Wet Prep HPF POC NONE SEEN NONE SEEN   Trich, Wet Prep NONE SEEN NONE SEEN   Clue Cells Wet Prep HPF POC PRESENT (A) NONE SEEN   WBC, Wet Prep HPF POC >=10 (A) <10   Sperm NONE SEEN   Hepatitis A antibody, total   Collection Time: 03/25/23  7:46 PM  Result Value Ref Range   hep A Total Ab Reactive (A) NON REACTIVE  HCV RNA quant rflx ultra or genotyp   Collection Time: 03/25/23  7:46 PM  Result Value Ref Range   HCV RNA Qnt(log copy/mL) UNABLE TO CALCULATE log10 IU/mL   HepC Qn HCV Not Detected IU/mL   Test Information Comment    Hcv Genotype RTNI   Hepatitis B surface antibody,quantitative   Collection Time: 03/25/23  7:46 PM  Result Value Ref Range   Hep B S AB Quant (Post) 9.2 (L) Immunity>10 mIU/mL  Comprehensive metabolic panel   Collection Time: 03/25/23  7:46 PM  Result Value Ref Range   Sodium 139 135 - 145 mmol/L   Potassium 3.3 (L) 3.5 - 5.1 mmol/L   Chloride 103 98 - 111 mmol/L   CO2 28 22 - 32 mmol/L   Glucose, Bld 102 (H) 70 - 99 mg/dL   BUN 5 (L) 6 - 20 mg/dL   Creatinine, Ser 5.40 0.44 - 1.00 mg/dL   Calcium 8.8 (L) 8.9 - 10.3 mg/dL   Total Protein 5.6 (L) 6.5 - 8.1 g/dL   Albumin 2.6 (L) 3.5 - 5.0 g/dL   AST 18 15 - 41 U/L   ALT 11 0 - 44 U/L   Alkaline Phosphatase 60 38 - 126 U/L   Total Bilirubin 0.4 0.0 - 1.2 mg/dL   GFR, Estimated >98 >11 mL/min   Anion gap 8 5 - 15  CBC   Collection Time: 03/25/23  7:46 PM  Result Value Ref Range   WBC 8.5 4.0 - 10.5 K/uL   RBC 3.25 (L) 3.87 - 5.11 MIL/uL   Hemoglobin 10.1 (L) 12.0 - 15.0 g/dL   HCT 91.4 (L) 78.2 - 95.6 %   MCV 98.5 80.0 - 100.0 fL   MCH 31.1 26.0 - 34.0 pg    MCHC 31.6 30.0 - 36.0 g/dL   RDW 21.3 08.6 - 57.8 %   Platelets 212 150 - 400 K/uL   nRBC 0.0 0.0 - 0.2 %  Type and screen [facility]   Collection Time: 03/25/23  7:46 PM  Result Value Ref Range   ABO/RH(D) A POS    Antibody  Screen NEG    Sample Expiration      03/28/2023,2359 Performed at [facility], [address] [place], [place] [address]    [place] MFM OB COMP + 14 WK Result Date: 03/07/2023 ----------------------------------------------------------------------  OBSTETRICS REPORT                       (Signed Final 03/07/2023 01:06 pm) ---------------------------------------------------------------------- Patient Info  ID #:       [id number]                          D.O.B.:  07-Sep-2000 (22 yrs)(F)  Name:       Orlean Patten Burgener                 Visit Date: 03/07/2023 08:07 am ---------------------------------------------------------------------- Performed By  Attending:        Noralee Space MD        Referred By:      [facility]                                                             Care  Performed By:     Percell Boston          [facility] ---------------------------------------------------------------------- Orders  #  Description                           Code        Ordered By  1  [place] MFM OB COMP + 14 WK                46962.95    Tinnie Gens ----------------------------------------------------------------------  #  Order #                     Accession #                Episode #  1  [id number]                   [id number]                 [id number] ---------------------------------------------------------------------- Indications  [redacted] weeks gestation of pregnancy                Z3A.25  Drug use complicating pregnancy, second        O99.322  trimester  Encounter for antenatal screening,             Z36.9  unspecified  ---------------------------------------------------------------------- Fetal Evaluation  Num Of Fetuses:         1  Fetal Heart Rate(bpm):  170  Cardiac Activity:       Observed  Presentation:           Transverse, head to maternal left  Placenta:  Posterior  P. Cord Insertion:      Visualized, central  Amniotic Fluid  AFI FV:      Within normal limits                              Largest Pocket(cm)                              3.8 ---------------------------------------------------------------------- Biometry  BPD:      61.1  mm     G. Age:  24w 6d         15  %    CI:        72.19   %    70 - 86                                                          FL/HC:      19.5   %    18.6 - 20.4  HC:      228.8  mm     G. Age:  24w 6d          8  %    HC/AC:      1.09        1.04 - 1.22  AC:       210   mm     G. Age:  25w 4d         35  %    FL/BPD:     73.0   %    71 - 87  FL:       44.6  mm     G. Age:  24w 5d         11  %    FL/AC:      21.2   %    20 - 24  HUM:      40.6  mm     G. Age:  24w 5d         18  %  CER:      26.8  mm     G. Age:  24w 0d         12  %  Est. FW:     775  gm    1 lb 11 oz      18  % ---------------------------------------------------------------------- OB History  Gravidity:    5         Term:   1        Prem:   1        SAB:   2  TOP:          0       Ectopic:  0        Living: 2 ---------------------------------------------------------------------- Gestational Age  U/S Today:     25w 0d                                        EDD:   06/20/23  Best:          25w 5d     Det. By:  Marcella Dubs  EDD:   06/15/23                                      (11/21/22) ---------------------------------------------------------------------- Anatomy  Cranium:               Appears normal         LVOT:                   Appears normal  Cavum:                 Appears normal         Aortic Arch:            Not well visualized  Ventricles:            Appears normal         Ductal Arch:             Not well visualized  Choroid Plexus:        Appears normal         Diaphragm:              Appears normal  Cerebellum:            Appears normal         Stomach:                Appears normal, left                                                                        sided  Posterior Fossa:       Appears normal         Abdomen:                Appears normal  Nuchal Fold:           Not applicable (>20    Abdominal Wall:         Appears nml (cord                         wks GA)                                        insert, abd wall)  Face:                  Appears normal         Cord Vessels:           Appears normal (3                         (orbits and profile)                           vessel cord)  Lips:                  Appears normal         Kidneys:  Appear normal  Palate:                Appears normal         Bladder:                Appears normal  Thoracic:              Appears normal         Spine:                  Not well visualized  Heart:                 Appears normal         Upper Extremities:      Visualized                         (4CH, axis, and                         situs)  RVOT:                  Not well visualized    Lower Extremities:      Visualized  Other:  Fetus appears to be a female. Technically difficult due to advanced GA          and fetal position. ---------------------------------------------------------------------- Cervix Uterus Adnexa  Cervix  Normal appearance by transabdominal scan  Uterus  No abnormality visualized.  Right Ovary  Not visualized.  Left Ovary  Not visualized.  Cul De Sac  No free fluid seen.  Adnexa  No abnormality visualized ---------------------------------------------------------------------- Impression  Substance use complicating pregnancy with withdrawal  symptoms.  We performed a fetal anatomical survey.  Amniotic fluid is  normal and good fetal activity seen.  No obvious fetal  structural defects are seen.  Fetal biometry is consistent  with  the previously established dates. ---------------------------------------------------------------------- Recommendations  -Recommend completion of fetal anatomy in 4 weeks. ----------------------------------------------------------------------                 Noralee Space, MD Electronically Signed Final Report   03/07/2023 01:06 pm ----------------------------------------------------------------------    Future Appointments  Date Time Provider Department Center  03/31/2023  2:55 PM Venora Maples, MD [facility]  04/11/2023 11:15 AM [facility] NURSE [facility]  04/11/2023 11:30 AM [facility] US4 [facility]    Discharge Condition: Stable  Discharge disposition: 01-Home or Self Care        Allergies as of 03/30/2023       Reactions   Flax Seed Oil [flax Seed Oil] Swelling   blisters   Red Dye #40 (allura Red) Nausea And Vomiting   Codeine Nausea And Vomiting, Other (See Comments)   Blisters as a child- tolerates percocet and other pain medications without issue        Medication List     STOP taking these medications    buprenorphine 8 MG Subl SL tablet Commonly known as: SUBUTEX       TAKE these medications    dicyclomine 10 MG capsule Commonly known as: BENTYL Take 1 capsule (10 mg total) by mouth 3 (three) times daily before meals.   naloxone 4 MG/0.1ML Liqd nasal spray kit Commonly known as: NARCAN Use as needed to reverse opioid overdose   omeprazole 20 MG tablet Commonly known as: PriLOSEC OTC Take 1 tablet (20 mg total) by mouth in the morning and at bedtime.  prenatal multivitamin Tabs tablet Take 1 tablet by mouth daily at 12 noon.   promethazine 25 MG tablet Commonly known as: PHENERGAN Take 1 tablet (25 mg total) by mouth every 6 (six) hours as needed for nausea or vomiting.   traZODone 50 MG tablet Commonly known as: DESYREL Take 1 tablet (50 mg total) by mouth at bedtime.         Total discharge time: 30 minutes    Signed: Venora Maples M.D. 03/30/2023, 1:15 PM

## 2023-03-31 ENCOUNTER — Encounter: Payer: 59 | Admitting: Family Medicine

## 2023-03-31 ENCOUNTER — Encounter: Payer: Self-pay | Admitting: Family Medicine

## 2023-04-04 DIAGNOSIS — O09899 Supervision of other high risk pregnancies, unspecified trimester: Secondary | ICD-10-CM | POA: Insufficient documentation

## 2023-04-05 ENCOUNTER — Encounter: Payer: 59 | Admitting: Family Medicine

## 2023-04-06 ENCOUNTER — Telehealth: Payer: Self-pay

## 2023-04-06 NOTE — Telephone Encounter (Addendum)
 Patient called office to request refill of Subutex . Pt reports she was not able to go to previously planned inpatient program. Offered appointment on 4 PM for patient to speak with provider prior to refill. Pt agreeable and added to the schedule, but did not keep appointment.  04/05/23 Vernell RN

## 2023-04-07 ENCOUNTER — Ambulatory Visit: Payer: 59 | Admitting: Family Medicine

## 2023-04-07 VITALS — BP 115/77 | HR 91 | Wt 147.0 lb

## 2023-04-07 DIAGNOSIS — F319 Bipolar disorder, unspecified: Secondary | ICD-10-CM

## 2023-04-07 DIAGNOSIS — O0993 Supervision of high risk pregnancy, unspecified, third trimester: Secondary | ICD-10-CM | POA: Diagnosis not present

## 2023-04-07 DIAGNOSIS — Z3A30 30 weeks gestation of pregnancy: Secondary | ICD-10-CM

## 2023-04-07 DIAGNOSIS — O0990 Supervision of high risk pregnancy, unspecified, unspecified trimester: Secondary | ICD-10-CM

## 2023-04-07 DIAGNOSIS — O09893 Supervision of other high risk pregnancies, third trimester: Secondary | ICD-10-CM

## 2023-04-07 DIAGNOSIS — O09899 Supervision of other high risk pregnancies, unspecified trimester: Secondary | ICD-10-CM

## 2023-04-07 DIAGNOSIS — F1190 Opioid use, unspecified, uncomplicated: Secondary | ICD-10-CM

## 2023-04-07 DIAGNOSIS — F1990 Other psychoactive substance use, unspecified, uncomplicated: Secondary | ICD-10-CM | POA: Diagnosis not present

## 2023-04-07 DIAGNOSIS — O99013 Anemia complicating pregnancy, third trimester: Secondary | ICD-10-CM

## 2023-04-07 MED ORDER — FERROUS SULFATE 325 (65 FE) MG PO TBEC: 325.0000 mg | DELAYED_RELEASE_TABLET | ORAL | 2 refills | Status: DC

## 2023-04-07 MED ORDER — BUPRENORPHINE HCL 8 MG SL SUBL: 8.0000 mg | SUBLINGUAL_TABLET | Freq: Three times a day (TID) | SUBLINGUAL | 0 refills | Status: DC

## 2023-04-07 NOTE — Patient Instructions (Signed)

## 2023-04-07 NOTE — Progress Notes (Signed)
 Subjective:  Laura Callahan is a 23 y.o. [phone] at [redacted]w[redacted]d being seen today for ongoing prenatal care.  She is currently monitored for the following issues for this high-risk pregnancy and has ADHD (attention deficit hyperactivity disorder), combined type; Bipolar I disorder (HCC); Uncomplicated opioid dependence (HCC); Anemia in pregnancy; Supervision of high risk pregnancy, antepartum; Bacteria vaginitis affecting pregnancy in third trimester, antepartum; Polysubstance use disorder; Opioid abuse (HCC); Cocaine  use complicating pregnancy; [redacted] weeks gestation of pregnancy; Drug use complicating pregnancy; Opioid use disorder; Pregnancy complicated by subutex  maintenance, antepartum (HCC); and History of preterm delivery, currently pregnant on their problem list.  Patient reports no complaints.  Contractions: Not present. Vag. Bleeding: None.  Movement: Present. Denies leaking of fluid.   The following portions of the patient's history were reviewed and updated as appropriate: allergies, current medications, past family history, past medical history, past social history, past surgical history and problem list. Problem list updated.  Objective:   Vitals:   04/07/23 1323  BP: 115/77  Pulse: 91  Weight: 147 lb (66.7 kg)    Fetal Status: Fetal Heart Rate (bpm): 145   Movement: Present     General:  Alert, oriented and cooperative. Patient is in no acute distress.  Skin: Skin is warm and dry. No rash noted.   Cardiovascular: Normal heart rate noted  Respiratory: Normal respiratory effort, no problems with respiration noted  Abdomen: Soft, gravid, appropriate for gestational age. Pain/Pressure: Absent     Pelvic: Vag. Bleeding: None     Cervical exam deferred        Extremities: Normal range of motion.  Edema: None  Mental Status: Normal mood and affect. Normal behavior. Normal judgment and thought content.   Urinalysis:      Assessment and Plan:  Pregnancy: [id number] at [redacted]w[redacted]d  1.  Supervision of high risk pregnancy, antepartum (Primary) BP and FHR normal TDAP/flu deferred to next visit Will plan to draw panorama and horizon at next visit  2. Polysubstance use disorder Last saw after discharge from hospital about a week ago when she left AMA, at that time reported she wanted to pursue abstinence and was planning on going to inpatient rehab at [facility] Reports that she was not accepted into [facility] due to pending legal issues Reports she has been taking subutex  and has been feeling better than she has in the past when taking it. When asked why she decided to continue she reports it was because she was not in a rehab facility and was worried about having access to opioids without any protection on board Refill sent for subutex  (confirmed she does not want suboxone , she is aware this is not my preferred med in this scenario)  UDS given after verbal consent Will see back in two weeks [ ]  Hep B non-immune, needs vaccination post partum - ToxAssure Flex 15, Ur  3. History of preterm delivery, currently pregnant At [redacted] weeks, spontaneous  4. Bipolar I disorder (HCC) Reports long hx, has been on abilify , lamictal, klonopin, many other meds in the past Accepts referral to Psychiatry Plan to see Warren at next visit  5. Anemia during pregnancy in third trimester Lab Results  Component Value Date   HGB 10.1 (L) 03/25/2023   Mild, start PO iron   Preterm labor symptoms and general obstetric precautions including but not limited to vaginal bleeding, contractions, leaking of fluid and fetal movement were reviewed in detail with the patient. Please refer to After Visit Summary for other counseling recommendations.  Return in 2 weeks (on 04/21/2023) for RE[facility] clinic, ob visit.   Laura Donnice HERO, MD

## 2023-04-11 ENCOUNTER — Ambulatory Visit: Payer: 59 | Attending: Advanced Practice Midwife

## 2023-04-11 ENCOUNTER — Ambulatory Visit: Payer: 59

## 2023-04-11 DIAGNOSIS — O09899 Supervision of other high risk pregnancies, unspecified trimester: Secondary | ICD-10-CM

## 2023-04-11 DIAGNOSIS — O99320 Drug use complicating pregnancy, unspecified trimester: Secondary | ICD-10-CM

## 2023-04-11 DIAGNOSIS — O0990 Supervision of high risk pregnancy, unspecified, unspecified trimester: Secondary | ICD-10-CM

## 2023-04-11 LAB — TOXASSURE FLEX 15, UR
6-ACETYLMORPHINE IA: NEGATIVE ng/mL
7-aminoclonazepam: NOT DETECTED ng/mg{creat}
AMPHETAMINES IA: NEGATIVE ng/mL
Alpha-hydroxyalprazolam: NOT DETECTED ng/mg{creat}
Alpha-hydroxymidazolam: NOT DETECTED ng/mg{creat}
Alpha-hydroxytriazolam: NOT DETECTED ng/mg{creat}
Alprazolam: NOT DETECTED ng/mg{creat}
BARBITURATES IA: NEGATIVE ng/mL
BUPRENORPHINE: POSITIVE
Benzodiazepines: NEGATIVE
Buprenorphine: 245 ng/mg{creat}
CANNABINOIDS IA: NEGATIVE ng/mL
COCAINE METABOLITE IA: NEGATIVE ng/mL
Clonazepam: NOT DETECTED ng/mg{creat}
Creatinine: 51 mg/dL
Desalkylflurazepam: NOT DETECTED ng/mg{creat}
Desmethyldiazepam: NOT DETECTED ng/mg{creat}
Desmethylflunitrazepam: NOT DETECTED ng/mg{creat}
Diazepam: NOT DETECTED ng/mg{creat}
ETHYL ALCOHOL Enzymatic: NEGATIVE g/dL
FENTANYL: NEGATIVE
Fentanyl: NOT DETECTED ng/mg{creat}
Flunitrazepam: NOT DETECTED ng/mg{creat}
Lorazepam: NOT DETECTED ng/mg{creat}
METHADONE IA: NEGATIVE ng/mL
METHADONE MTB IA: NEGATIVE ng/mL
Midazolam: NOT DETECTED ng/mg{creat}
Norbuprenorphine: >1961 ng/mg{creat}
Norfentanyl: NOT DETECTED ng/mg{creat}
OPIATE CLASS IA: NEGATIVE ng/mL
OXYCODONE CLASS IA: NEGATIVE ng/mL
Oxazepam: NOT DETECTED ng/mg{creat}
PHENCYCLIDINE IA: NEGATIVE ng/mL
TAPENTADOL, IA: NEGATIVE ng/mL
TRAMADOL IA: NEGATIVE ng/mL
Temazepam: NOT DETECTED ng/mg{creat}

## 2023-04-19 ENCOUNTER — Encounter: Payer: 59 | Admitting: Family Medicine

## 2023-04-19 ENCOUNTER — Telehealth: Payer: Self-pay | Admitting: Family Medicine

## 2023-04-19 NOTE — Telephone Encounter (Signed)
I reached out to the patient to get her rescheduled but no answer, received an error message. Mychart message was sent.

## 2023-04-22 ENCOUNTER — Telehealth: Payer: Self-pay | Admitting: Clinical

## 2023-04-22 NOTE — Telephone Encounter (Signed)
Attempt call regarding referral; Unable to leave voice message.   

## 2023-05-03 ENCOUNTER — Inpatient Hospital Stay (HOSPITAL_COMMUNITY)

## 2023-05-03 ENCOUNTER — Other Ambulatory Visit: Payer: Self-pay | Admitting: Obstetrics and Gynecology

## 2023-05-03 ENCOUNTER — Encounter: Admitting: Family Medicine

## 2023-05-03 ENCOUNTER — Inpatient Hospital Stay (HOSPITAL_COMMUNITY)
Admission: AD | Admit: 2023-05-03 | Discharge: 2023-05-03 | Disposition: A | Discharge status: Home / Self Care | Attending: Obstetrics and Gynecology | Admitting: Obstetrics and Gynecology

## 2023-05-03 ENCOUNTER — Other Ambulatory Visit: Payer: Self-pay

## 2023-05-03 ENCOUNTER — Encounter (HOSPITAL_COMMUNITY): Payer: Self-pay | Admitting: Obstetrics & Gynecology

## 2023-05-03 DIAGNOSIS — O99323 Drug use complicating pregnancy, third trimester: Secondary | ICD-10-CM

## 2023-05-03 DIAGNOSIS — Z3A33 33 weeks gestation of pregnancy: Secondary | ICD-10-CM

## 2023-05-03 DIAGNOSIS — O365990 Maternal care for other known or suspected poor fetal growth, unspecified trimester, not applicable or unspecified: Secondary | ICD-10-CM | POA: Diagnosis present

## 2023-05-03 DIAGNOSIS — F1190 Opioid use, unspecified, uncomplicated: Secondary | ICD-10-CM

## 2023-05-03 DIAGNOSIS — O365930 Maternal care for other known or suspected poor fetal growth, third trimester, not applicable or unspecified: Secondary | ICD-10-CM

## 2023-05-03 DIAGNOSIS — O99320 Drug use complicating pregnancy, unspecified trimester: Secondary | ICD-10-CM

## 2023-05-03 DIAGNOSIS — G4700 Insomnia, unspecified: Secondary | ICD-10-CM | POA: Insufficient documentation

## 2023-05-03 DIAGNOSIS — O0990 Supervision of high risk pregnancy, unspecified, unspecified trimester: Secondary | ICD-10-CM

## 2023-05-03 DIAGNOSIS — F1120 Opioid dependence, uncomplicated: Secondary | ICD-10-CM

## 2023-05-03 DIAGNOSIS — F1110 Opioid abuse, uncomplicated: Secondary | ICD-10-CM | POA: Insufficient documentation

## 2023-05-03 DIAGNOSIS — Z79899 Other long term (current) drug therapy: Secondary | ICD-10-CM | POA: Insufficient documentation

## 2023-05-03 DIAGNOSIS — F1910 Other psychoactive substance abuse, uncomplicated: Secondary | ICD-10-CM

## 2023-05-03 DIAGNOSIS — R197 Diarrhea, unspecified: Secondary | ICD-10-CM | POA: Insufficient documentation

## 2023-05-03 DIAGNOSIS — F1990 Other psychoactive substance use, unspecified, uncomplicated: Secondary | ICD-10-CM

## 2023-05-03 DIAGNOSIS — O99353 Diseases of the nervous system complicating pregnancy, third trimester: Secondary | ICD-10-CM | POA: Insufficient documentation

## 2023-05-03 DIAGNOSIS — O99891 Other specified diseases and conditions complicating pregnancy: Secondary | ICD-10-CM | POA: Insufficient documentation

## 2023-05-03 DIAGNOSIS — O0933 Supervision of pregnancy with insufficient antenatal care, third trimester: Secondary | ICD-10-CM

## 2023-05-03 LAB — RAPID URINE DRUG SCREEN, HOSP PERFORMED
Amphetamines: POSITIVE — AB
Barbiturates: NOT DETECTED
Benzodiazepines: POSITIVE — AB
Cocaine: POSITIVE — AB
Opiates: POSITIVE — AB
Tetrahydrocannabinol: NOT DETECTED

## 2023-05-03 MED ORDER — TIZANIDINE HCL 4 MG PO TABS: 4.0000 mg | ORAL_TABLET | Freq: Four times a day (QID) | ORAL | Status: DC | PRN

## 2023-05-03 MED ORDER — ACETAMINOPHEN 500 MG PO TABS: 1000.0000 mg | ORAL_TABLET | Freq: Four times a day (QID) | ORAL | Status: DC | PRN

## 2023-05-03 MED ORDER — CYCLOBENZAPRINE HCL 10 MG PO TABS: 10.0000 mg | ORAL_TABLET | Freq: Three times a day (TID) | ORAL | Status: DC | PRN

## 2023-05-03 MED ORDER — DIPHENHYDRAMINE HCL 25 MG PO CAPS: 50.0000 mg | ORAL_CAPSULE | ORAL | Status: DC | PRN

## 2023-05-03 MED ORDER — DICYCLOMINE HCL 20 MG PO TABS: 20.0000 mg | ORAL_TABLET | Freq: Three times a day (TID) | ORAL | Status: DC | PRN

## 2023-05-03 MED ORDER — BISMUTH SUBSALICYLATE 262 MG PO CHEW: 524.0000 mg | CHEWABLE_TABLET | ORAL | Status: DC | PRN

## 2023-05-03 MED ORDER — ONDANSETRON 4 MG PO TBDP: 8.0000 mg | ORAL_TABLET | Freq: Two times a day (BID) | ORAL | Status: DC | PRN

## 2023-05-03 MED ORDER — ALUM & MAG HYDROXIDE-SIMETH 200-200-20 MG/5ML PO SUSP: 30.0000 mL | ORAL | Status: DC | PRN

## 2023-05-03 MED ORDER — TRAZODONE HCL 100 MG PO TABS
100.0000 mg | ORAL_TABLET | Freq: Every evening | ORAL | Status: DC | PRN
Start: 2023-05-03 — End: 2023-05-03

## 2023-05-03 NOTE — Plan of Care (Signed)

## 2023-05-03 NOTE — Social Work (Signed)
 CSW acknowledged consult Substance Abuse Counseling/Education. CSW met with Laura Callahan to offer support and complete assessment.    CSW met with Laura Callahan at bedside and introduced role. CSW observed Laura Callahan lying in bed, and FOB was present and asleep at bedside. Laura Callahan allowed CSW to share all information with FOB present. Laura Callahan presented pleasant and engaged with CSW throughout the visit. CSW asked Laura Callahan how she has been doing. Laura Callahan reported that she has been doing okay. Laura Callahan explained that she had come to the hospital to begin detox and Subutex treatment. CSW acknowledged Laura Callahan readiness for treatment. Laura Callahan reported that she had reached out to several residential treatment facilities in the area, including [facility], but was informed that she could not be admitted due to her pending legal charges (violation of probation). Also, [facility] residential treatment center would not accept her insurance. CSW provided Laura Callahan with additional resources for residential treatment and encouraged her to call when she felt ready. CSW assessed Laura Callahan for additional needs. Laura Callahan reported that she had completed the application for [facility] and EBT benefits but needed to submit a letter with proof of pregnancy written by a medical provider. Laura Callahan reported that she would notify the provider of this request. CSW asked Laura Callahan about her current living situation. Laura Callahan reported that she currently lives with FOB and shared that he has been very supportive. Laura Callahan reported that she has an appointment with Dr. Crissie Reese at [facility] today and would make it if she is discharged.    No other needs identified.   Vivi Barrack, MSW, LCSW Clinical Social Worker  [phone] 05/03/2023  12:23 PM

## 2023-05-03 NOTE — Progress Notes (Signed)
 Daily Antepartum Note  Admission Date: 05/03/2023 Current Date: 05/03/2023 9:53 AM  Laura Callahan is a 23 y.o. [id number] at [redacted]w[redacted]d, HD#1, admitted for detox.  Pregnancy complicated by: Patient Active Problem List   Diagnosis Date Noted   History of preterm delivery, currently pregnant 04/04/2023   Drug use complicating pregnancy 03/26/2023   Opioid use disorder 03/26/2023   Pregnancy complicated by subutex maintenance, antepartum (HCC) 03/26/2023   Opioid abuse (HCC) 03/25/2023   Cocaine use complicating pregnancy 03/25/2023   [redacted] weeks gestation of pregnancy 03/25/2023   Bacteria vaginitis affecting pregnancy in third trimester, antepartum 03/11/2023   Polysubstance use disorder 03/11/2023   Supervision of high risk pregnancy, antepartum 03/10/2023   Anemia in pregnancy 03/09/2023   Uncomplicated opioid dependence (HCC) 03/08/2023   Bipolar I disorder (HCC) 03/06/2023   ADHD (attention deficit hyperactivity disorder), combined type 02/09/2012    Overnight/24hr events:  Just admitted a few hours ago  Subjective:  Patient resting comfortably; no OB s/s.   Objective:   Patient Vitals for the past 24 hrs:  BP Temp Temp src Pulse Resp SpO2 Height Weight  05/03/23 0516 111/62 98.1 F (36.7 C) Oral 67 16 99 % -- --  05/03/23 0416 116/70 -- -- 72 18 98 % -- --  05/03/23 0340 108/60 97.9 F (36.6 C) Oral 73 16 -- 5\' 5"  (1.651 m) 65.6 kg    Physical exam: General: Well nourished, well developed female in no acute distress. Abdomen: gravid nttp Respiratory: no respiratory distress Skin: Warm and dry.   Medications: Current Facility-Administered Medications  Medication Dose Route Frequency Provider Last Rate Last Admin   acetaminophen (TYLENOL) tablet 1,000 mg  1,000 mg Oral Q6H PRN Hessie Dibble, MD       alum & mag hydroxide-simeth (MAALOX/MYLANTA) 200-200-20 MG/5ML suspension 30 mL  30 mL Oral Q4H PRN Hessie Dibble, MD       cyclobenzaprine (FLEXERIL) tablet 10 mg  10 mg  Oral TID PRN Hessie Dibble, MD       dicyclomine (BENTYL) tablet 20 mg  20 mg Oral TID PRN Hessie Dibble, MD       diphenhydrAMINE (BENADRYL) capsule 50 mg  50 mg Oral Q4H PRN Hessie Dibble, MD       ondansetron (ZOFRAN-ODT) disintegrating tablet 8 mg  8 mg Oral BID PRN Hessie Dibble, MD       traZODone (DESYREL) tablet 100 mg  100 mg Oral QHS PRN Hessie Dibble, MD        Labs:  UDS rapid: +optiate, cocaine, benzos & amephetamines  Radiology:  none  Assessment & Plan:  Patient stable *Pregnancy: follow up growth and anatomy ordered for this morning -1/6: 18%, 775gm, ac 35%, afi wnl *OUD: d/w Dr. Crissie Reese and patient to have [facility] clinic appointment later this afternoon at 3:55; pt told to arrive around 3:30 *Preterm: no current issues *Dispo: after daily NST and u/s  Cornelia Copa MD Attending Center for [facility] (Faculty Practice) GYN Consult Phone: [phone] (M-F, [phone]) & [phone]  (Off hours, weekends, holidays)

## 2023-05-03 NOTE — Discharge Summary (Addendum)
 Physician Discharge Summary  Patient ID: Laura Callahan MRN: [id number] DOB/AGE: June 21, 2000 22 y.o.  Admit date: 05/03/2023 Discharge date: 05/03/2023  Admission Diagnoses: Pregnancy at 33/6. Desire for detox. Substance in pregnancy. Poor fetal growth  Discharge Diagnoses: Same. FGR  Discharged Condition: good  Hospital Course: Patient presented to [facility] triage overnight desiring detox and was admitted. REACH clinic appointment made for 3/4, in discussion with Dr. Crissie Reese, and patient was discharged for this appointment. Admit UDS rapid +optiate, cocaine, benzos & amephetamines.  Repeat growth u/s showed new FGR today with EFW 4.8%, 1852gm, ad 1.8%, afi 16, bpp 8/10 (category I but non reactive NST) with normal UA dopplers. She will need weekly dopplers from here on forward, with MFM. I set her up for her first one for next week  Discharge Exam: Blood pressure 111/64, pulse 74, temperature 97.9 F (36.6 C), temperature source Oral, resp. rate 16, height 5\' 5"  (1.651 m), weight 65.6 kg, SpO2 99%, unknown if currently breastfeeding. General: Well nourished, well developed female in no acute distress. Abdomen: gravid nttp Respiratory: no respiratory distress Skin: Warm and dry.  Neuro: grossly normal Psych: normal   Disposition: Discharge disposition: 01-Home or Self Care       Discharge Instructions     Discharge patient   Complete by: As directed    Discharge disposition: 01-Home or Self Care   Discharge patient date: 05/03/2023      Allergies as of 05/03/2023       Reactions   Flax Seed Oil [flax Seed Oil] Swelling   blisters   Codeine Nausea And Vomiting, Other (See Comments)   Blisters as a child- tolerates percocet and other pain medications without issue        Medication List     STOP taking these medications    buprenorphine 8 MG Subl SL tablet Commonly known as: SUBUTEX       TAKE these medications    dicyclomine 10 MG capsule Commonly known as:  BENTYL Take 1 capsule (10 mg total) by mouth 3 (three) times daily before meals.   ferrous sulfate 325 (65 FE) MG EC tablet Take 1 tablet (325 mg total) by mouth every other day.   naloxone 4 MG/0.1ML Liqd nasal spray kit Commonly known as: NARCAN Use as needed to reverse opioid overdose   omeprazole 20 MG tablet Commonly known as: PriLOSEC OTC Take 1 tablet (20 mg total) by mouth in the morning and at bedtime.   prenatal multivitamin Tabs tablet Take 1 tablet by mouth daily at 12 noon.   promethazine 25 MG tablet Commonly known as: PHENERGAN Take 1 tablet (25 mg total) by mouth every 6 (six) hours as needed for nausea or vomiting.   traZODone 50 MG tablet Commonly known as: DESYREL Take 1 tablet (50 mg total) by mouth at bedtime.        Follow-up Information     Center for [facility] at [facility]. Go on 05/03/2023.   Specialty: Obstetrics and Gynecology Why: at 3:30 this afternoon Contact information: [phone] [place] [id number] [phone]                Future Appointments  Date Time Provider Department Center  05/03/2023  3:55 PM Venora Maples, MD [facility]  05/10/2023  7:15 AM [facility] NURSE [facility]  05/10/2023  7:30 AM [facility] US3 [facility]    Lakeside Bing, MD   Signed: Cotati Bing 05/03/2023, 12:43 PM

## 2023-05-03 NOTE — Plan of Care (Signed)
  Problem: Education: Goal: Knowledge of General Education information will improve Description: Including pain rating scale, medication(s)/side effects and non-pharmacologic comfort measures 05/03/2023 1319 by Samuella Cota, RN Outcome: Adequate for Discharge 05/03/2023 1318 by Samuella Cota, RN Outcome: Adequate for Discharge   Problem: Health Behavior/Discharge Planning: Goal: Ability to manage health-related needs will improve 05/03/2023 1319 by Samuella Cota, RN Outcome: Adequate for Discharge 05/03/2023 1318 by Samuella Cota, RN Outcome: Adequate for Discharge   Problem: Clinical Measurements: Goal: Ability to maintain clinical measurements within normal limits will improve 05/03/2023 1319 by Samuella Cota, RN Outcome: Adequate for Discharge 05/03/2023 1318 by Samuella Cota, RN Outcome: Adequate for Discharge Goal: Will remain free from infection 05/03/2023 1319 by Samuella Cota, RN Outcome: Adequate for Discharge 05/03/2023 1318 by Samuella Cota, RN Outcome: Adequate for Discharge Goal: Diagnostic test results will improve 05/03/2023 1319 by Samuella Cota, RN Outcome: Adequate for Discharge 05/03/2023 1318 by Samuella Cota, RN Outcome: Adequate for Discharge Goal: Respiratory complications will improve 05/03/2023 1319 by Samuella Cota, RN Outcome: Adequate for Discharge 05/03/2023 1318 by Samuella Cota, RN Outcome: Adequate for Discharge Goal: Cardiovascular complication will be avoided 05/03/2023 1319 by Samuella Cota, RN Outcome: Adequate for Discharge 05/03/2023 1318 by Samuella Cota, RN Outcome: Adequate for Discharge   Problem: Activity: Goal: Risk for activity intolerance will decrease 05/03/2023 1319 by Samuella Cota, RN Outcome: Adequate for Discharge 05/03/2023 1318 by Samuella Cota, RN Outcome: Adequate for Discharge   Problem: Nutrition: Goal: Adequate nutrition will be maintained 05/03/2023 1319 by Samuella Cota, RN Outcome: Adequate for Discharge 05/03/2023 1318 by Samuella Cota,  RN Outcome: Adequate for Discharge   Problem: Coping: Goal: Level of anxiety will decrease 05/03/2023 1319 by Samuella Cota, RN Outcome: Adequate for Discharge 05/03/2023 1318 by Samuella Cota, RN Outcome: Adequate for Discharge   Problem: Elimination: Goal: Will not experience complications related to bowel motility 05/03/2023 1319 by Samuella Cota, RN Outcome: Adequate for Discharge 05/03/2023 1318 by Samuella Cota, RN Outcome: Adequate for Discharge Goal: Will not experience complications related to urinary retention 05/03/2023 1319 by Samuella Cota, RN Outcome: Adequate for Discharge 05/03/2023 1318 by Samuella Cota, RN Outcome: Adequate for Discharge   Problem: Pain Managment: Goal: General experience of comfort will improve and/or be controlled 05/03/2023 1319 by Samuella Cota, RN Outcome: Adequate for Discharge 05/03/2023 1318 by Samuella Cota, RN Outcome: Adequate for Discharge   Problem: Safety: Goal: Ability to remain free from injury will improve 05/03/2023 1319 by Samuella Cota, RN Outcome: Adequate for Discharge 05/03/2023 1318 by Samuella Cota, RN Outcome: Adequate for Discharge   Problem: Skin Integrity: Goal: Risk for impaired skin integrity will decrease 05/03/2023 1319 by Samuella Cota, RN Outcome: Adequate for Discharge 05/03/2023 1318 by Samuella Cota, RN Outcome: Adequate for Discharge
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
## 2023-05-03 NOTE — MAU Provider Note (Addendum)
 Detox     S Laura Callahan is a 23 y.o. [phone] pregnant female at [redacted]w[redacted]d who presents to MAU today with complaint of wanting to detox.  Pt states that she has never been in full withdrawal as she admits that she has had substances brought to the hospital shortly after starting subutex last admission given she believed after first dose of subutex it made her withdrawal worse.  She states her boyfriend (FOB) didn't know that her cousin brought her fentanyl tonight.  She states she's been using roughly 1g / 24hrs in about 6 divided dosed snorted bumps.  Last used ~0230 05/03/23. Pt also states that she didn't like suboxone 2/2 constipation.  Seems open to maybe another trial with bowel regimen.   On chart review: 03/06/23 - 03/10/23 = admission for opiod w/d, started on subutex  03/25/23 = relapse, desired to resume subutex, allowed withdrawal and resumed subutex however took intermittently and not scheduled.  SW coordinated plan for her to be admitted to [facility] on 04/01/23; Left AMA 1/29 stating subutex made her untreated anxiety worse and despite higher statistics of increased mortality in abstinence only approaches she left with only abstinence only approach, sent with NARCAN px   04/07/23 = followed up at [facility], states not accepted to [facility] 2/2 pending legal issues, reports taking subutex stating she wanted to have a safety net w/o [facility] being an option, refill sent for subutex at that time (pt declined suboxone), she also 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 Prenatal records reviewed.  Pertinent items noted in HPI and remainder of comprehensive ROS otherwise negative.   O BP 108/60 (BP Location: Right Arm)   Pulse 73   Temp 97.9 F (36.6 C) (Oral)   Resp 16   Ht 5\' 5"  (1.651 m)   Wt 65.6 kg   BMI 24.08 kg/m  Physical Exam Vitals and nursing note reviewed.  Constitutional:      General: She is not in acute  distress.    Appearance: Normal appearance. She is normal weight. She is not ill-appearing.  HENT:     Head: Normocephalic and atraumatic.     Right Ear: External ear normal.     Left Ear: External ear normal.     Nose: Nose normal. No congestion.     Mouth/Throat:     Mouth: Mucous membranes are moist.     Pharynx: Oropharynx is clear.  Eyes:     Extraocular Movements: Extraocular movements intact.     Conjunctiva/sclera: Conjunctivae normal.  Cardiovascular:     Rate and Rhythm: Normal rate.  Pulmonary:     Effort: Pulmonary effort is normal. No respiratory distress.  Abdominal:     General: There is no distension.     Tenderness: There is no abdominal tenderness.     Comments: gravid  Musculoskeletal:     Cervical back: Normal range of motion.  Skin:    General: Skin is warm and dry.     Comments: Smooth not goose fleshed  Neurological:     Mental Status: She is alert and oriented to person, place, and time. Mental status is at baseline.     Motor: No weakness.     Gait: Gait normal.  Psychiatric:        Mood and Affect: Mood normal.        Behavior: Behavior normal.        Thought Content: Thought content normal.  Judgment: Judgment normal.   NST: 125bpm, moderate variability, +accels, no decels, uterine irritability   MDM: MAU Course:  Had originally called Dr. Debroah Loop for guidance on visitor policies for MAT patients for detox.  He was unaware of any restrictions but agreed that given her hx of attempts would be best to observe in MAU until we could gain clarity from Dr. Crissie Reese. At this time no need to consult at this time.  However, when told that it might be best to limit visitors at this time she states she wouldn't stay.  She also states that if she stayed in the MAU for observation she would also just leave and prefer to call Dr. Crissie Reese herself for direct admission.  We were able to come to compromise that she could be admitted now but with restricted  visitor being only FOB/boyfriend at this time.  Both pt and FOB understanding and agreeable.  Pt was also agreeable with UDS at this time. Plan to call Dr. Crissie Reese if COW approaches 8 for recommendation before reaches COWs of 12.  Dr. Debroah Loop consulted and agreeable with that plan.  I'll plan PRNs to help keep patient comfortable at this time.    AP #[redacted] weeks gestation #OUD #Desires to withdrawal  - COWs score Q4hrs, call Dr. Crissie Reese for recommendations if COW score approaching 8 as usually start scheduled medications ~12 - acetaminophen 1g for MM aches / joint pain / HA, Q6hr PRN - Maalox / Mylanta diarrhea / indgestion - Flexeril 10mg  TID PRN MM aches / restless legs - Bentyl 20mg  TID PRN stomach spasms  - Benadryl 50mg  Q4hrs PRN anxiety / irritability / restlessness - Zofran 8mg  ODT BID PRN N/V - Trazodone 100mg  at bedtime insomnia   Admit to [facility] for detox admission    Hessie Dibble, MD 05/03/2023 3:54 AM

## 2023-05-03 NOTE — MAU Note (Signed)
 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 Last time used was at 0230.  She snorts q2 hrs.  She was here few weeks ago- to detox- Plan- was wait till goes into withdrawls and Subutex- BUT Subutex made her worse- so she left AMA .

## 2023-05-03 NOTE — H&P (Addendum)
 Detox      S [name] is a 23 y.o. [phone] pregnant female at [redacted]w[redacted]d who presents to MAU today with complaint of wanting to detox.  Pt states that she has never been in full withdrawal as she admits that she has had substances brought to the hospital shortly after starting subutex last admission given she believed after first dose of subutex it made her withdrawal worse.  She states her boyfriend (FOB) didn't know that her cousin brought her fentanyl tonight.  She states she's been using roughly 1g / 24hrs in about 6 divided dosed snorted bumps. She states this was to avoid withdrawal and not to get high, just avoid feeling sick.  Last used ~0230 05/03/23. Pt also states that she didn't like suboxone 2/2 constipation.  Seems open to maybe another trial with bowel regimen.    On chart review: 03/06/23 - 03/10/23 = admission for opiod w/d, started on subutex   03/25/23 = relapse, desired to resume subutex, allowed withdrawal and resumed subutex however took intermittently and not scheduled.  SW coordinated plan for her to be admitted to [facility] on 04/01/23; Left AMA 1/29 stating subutex made her untreated anxiety worse and despite higher statistics of increased mortality in abstinence only approaches she left with only abstinence only approach, sent with NARCAN px    04/07/23 = followed up at [facility], states not accepted to [facility] 2/2 pending legal issues, reports taking subutex stating she wanted to have a safety net w/o [facility] being an option, refill sent for subutex at that time (pt declined suboxone), she also accepted 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 Prenatal records reviewed.   Pertinent items noted in HPI and remainder of comprehensive ROS otherwise negative.    O BP 108/60 (BP Location: Right Arm)   Pulse 73   Temp 97.9 F (36.6 C) (Oral)   Resp 16   Ht 5\' 5"  (1.651 m)   Wt 65.6 kg    BMI 24.08 kg/m  Physical Exam Vitals and nursing note reviewed.  Constitutional:      General: She is not in acute distress.    Appearance: Normal appearance. She is normal weight. She is not ill-appearing.  HENT:     Head: Normocephalic and atraumatic.     Right Ear: External ear normal.     Left Ear: External ear normal.     Nose: Nose normal. No congestion.     Mouth/Throat:     Mouth: Mucous membranes are moist.     Pharynx: Oropharynx is clear.  Eyes:     Extraocular Movements: Extraocular movements intact.     Conjunctiva/sclera: Conjunctivae normal.  Cardiovascular:     Rate and Rhythm: Normal rate.  Pulmonary:     Effort: Pulmonary effort is normal. No respiratory distress.  Abdominal:     General: There is no distension.     Tenderness: There is no abdominal tenderness.     Comments: gravid  Musculoskeletal:     Cervical back: Normal range of motion.  Skin:    General: Skin is warm and dry.     Comments: Smooth not goose fleshed  Neurological:     Mental Status: She is alert and oriented to person, place, and time. Mental status is at baseline.     Motor: No weakness.     Gait: Gait normal.  Psychiatric:  Mood and Affect: Mood normal.        Behavior: Behavior normal.        Thought Content: Thought content normal.        Judgment: Judgment normal.    NST: 125bpm, moderate variability, +accels, no decels, uterine irritability   MDM: MAU Course:   Had originally called Dr. Debroah Loop for guidance on visitor policies for MAT patients for detox.  He was unaware of any restrictions but agreed that given her hx of attempts would be best to observe in MAU until we could gain clarity from Dr. Crissie Reese. At this time no need to consult at this time.  However, when told that it might be best to limit visitors at this time she states she wouldn't stay.  She also states that if she stayed in the MAU for observation she would also just leave and prefer to call Dr.  Crissie Reese herself for direct admission.  We were able to come to compromise that she could be admitted now but with restricted visitor being only FOB/boyfriend at this time.  Both pt and FOB understanding and agreeable.  Pt was also agreeable with UDS at this time. Plan to call Dr. Crissie Reese if COW approaches 8 for recommendation before reaches COWs of 12.  Dr. Debroah Loop consulted and agreeable with that plan.  I'll plan PRNs to help keep patient comfortable at this time.     AP #[redacted] weeks gestation #OUD #Desires to withdrawal  - COWs score Q4hrs, call Dr. Crissie Reese for recommendations if COW score approaching 8 as usually start scheduled medications ~12 - acetaminophen 1g for MM aches / joint pain / HA, Q6hr PRN - Maalox/Mylanta diarrhea / indgestion - Flexeril 10mg  TID PRN MM aches / restless legs - Bentyl 20mg  TID PRN stomach spasms  - Benadryl 50mg  Q4hrs PRN anxiety / irritability / restlessness - Zofran 8mg  ODT BID PRN N/V - Trazodone 100mg  at bedtime insomnia  - restricted to only FOB / boyfriend as in person visitor / deliveries until can consult Dr. Crissie Reese for updated policies        Hessie Dibble, MD 05/03/2023 3:54 AM

## 2023-05-04 ENCOUNTER — Telehealth: Admitting: Physician Assistant

## 2023-05-04 ENCOUNTER — Telehealth: Admitting: Nurse Practitioner

## 2023-05-04 DIAGNOSIS — T3695XA Adverse effect of unspecified systemic antibiotic, initial encounter: Secondary | ICD-10-CM | POA: Diagnosis not present

## 2023-05-04 DIAGNOSIS — B379 Candidiasis, unspecified: Secondary | ICD-10-CM

## 2023-05-04 MED ORDER — MONISTAT 1 COMBO PACK 1200 & 2 MG & % VA KIT: 1.0000 | PACK | Freq: Once | VAGINAL | 0 refills | Status: AC

## 2023-05-04 NOTE — Progress Notes (Signed)
 Patient was treated via e-visit just a while ago for antibiotic-induced yeast. She had not realized her treatment plan had come through. Reviewed plan with patient. Questions answered. No charge for video.

## 2023-05-04 NOTE — Progress Notes (Signed)

## 2023-05-05 ENCOUNTER — Telehealth: Payer: Self-pay | Admitting: Family Medicine

## 2023-05-05 ENCOUNTER — Encounter: Admitting: Family Medicine

## 2023-05-05 MED ORDER — FLUCONAZOLE 150 MG PO TABS: 150.0000 mg | ORAL_TABLET | Freq: Once | ORAL | 0 refills | Status: AC

## 2023-05-05 NOTE — Telephone Encounter (Signed)
 Patient states she used monistat and made yeast infection worst, She needs something to help asap.

## 2023-05-05 NOTE — Telephone Encounter (Signed)
 Rx sent for diflucan

## 2023-05-10 ENCOUNTER — Ambulatory Visit: Attending: Obstetrics and Gynecology

## 2023-05-10 ENCOUNTER — Ambulatory Visit

## 2023-05-10 ENCOUNTER — Ambulatory Visit: Admitting: Family Medicine

## 2023-05-10 VITALS — BP 106/62 | HR 60 | Wt 145.0 lb

## 2023-05-10 DIAGNOSIS — O09899 Supervision of other high risk pregnancies, unspecified trimester: Secondary | ICD-10-CM | POA: Diagnosis not present

## 2023-05-10 DIAGNOSIS — O365930 Maternal care for other known or suspected poor fetal growth, third trimester, not applicable or unspecified: Secondary | ICD-10-CM | POA: Diagnosis not present

## 2023-05-10 DIAGNOSIS — O0990 Supervision of high risk pregnancy, unspecified, unspecified trimester: Secondary | ICD-10-CM

## 2023-05-10 DIAGNOSIS — O99320 Drug use complicating pregnancy, unspecified trimester: Secondary | ICD-10-CM

## 2023-05-10 DIAGNOSIS — F1990 Other psychoactive substance use, unspecified, uncomplicated: Secondary | ICD-10-CM

## 2023-05-10 DIAGNOSIS — F1120 Opioid dependence, uncomplicated: Secondary | ICD-10-CM

## 2023-05-10 DIAGNOSIS — O99323 Drug use complicating pregnancy, third trimester: Secondary | ICD-10-CM

## 2023-05-10 DIAGNOSIS — Z3A34 34 weeks gestation of pregnancy: Secondary | ICD-10-CM

## 2023-05-10 DIAGNOSIS — O0993 Supervision of high risk pregnancy, unspecified, third trimester: Secondary | ICD-10-CM | POA: Diagnosis not present

## 2023-05-10 DIAGNOSIS — F319 Bipolar disorder, unspecified: Secondary | ICD-10-CM

## 2023-05-10 NOTE — Progress Notes (Signed)
 Met with Laura Callahan today at [facility] for ongoing substance exposed newborn consult. We discussed her ongoing prenatal care and current medication management. Evadene admitted to Fentanyl use. She does have access to Narcan at home. Continued to discuss risk of usage with fetus. Celebrated Nygeria's presence at clinic today and honesty.   Encouraged her to contact [facility] consult phone with any questions and/or concerns in between appointments. She verbally agreed.   Dennison Bulla, NNP-BC

## 2023-05-10 NOTE — Progress Notes (Signed)
 Subjective:  Laura Callahan is a 23 y.o. Z6X0960 at [redacted]w[redacted]d being seen today for ongoing prenatal care.  She is currently monitored for the following issues for this high-risk pregnancy and has ADHD (attention deficit hyperactivity disorder), combined type; Bipolar I disorder (HCC); Uncomplicated opioid dependence (HCC); Anemia in pregnancy; Supervision of high risk pregnancy, antepartum; Bacteria vaginitis affecting pregnancy in third trimester, antepartum; Polysubstance use disorder; Opioid abuse (HCC); Cocaine use complicating pregnancy; Drug use complicating pregnancy; Opioid use disorder; Pregnancy complicated by subutex maintenance, antepartum (HCC); History of preterm delivery, currently pregnant; IUGR (intrauterine growth restriction) affecting care of mother; and Insufficient prenatal care in third trimester on their problem list.  Patient reports going to hospital 05/03/23 requesting detox. Stayed overnight. Doesn't feel like Suboxone has worked well for her. Asking about detox, methadone options. New Dx FGR.  Contractions: Not present. Vag. Bleeding: None.  Movement: Present. Denies leaking of fluid.   The following portions of the patient's history were reviewed and updated as appropriate: allergies, current medications, past family history, past medical history, past social history, past surgical history and problem list. Problem list updated.  Objective:   Vitals:   05/10/23 1649  BP: 106/62  Pulse: 60  Weight: 145 lb (65.8 kg)    Fetal Status: Fetal Heart Rate (bpm): 141   Movement: Present     General:  Alert, oriented and cooperative. Patient is in no acute distress.  Skin: Skin is warm and dry. No rash noted.   Cardiovascular: Normal heart rate noted  Respiratory: Normal respiratory effort, no problems with respiration noted  Abdomen: Soft, gravid, appropriate for gestational age. Pain/Pressure: Absent     Pelvic: Vag. Bleeding: None     Cervical exam performed         Extremities: Normal range of motion.  Edema: None  Mental Status: Normal mood and affect. Normal behavior. Normal judgment and thought content.   Urinalysis:      PDMP not reviewed this encounter.yes   Last UDS: Lab Results  Component Value Date   CREATIUR 51 04/07/2023   [place] 05/03/23 Est. FW: 1852 gm 4 lb 1 oz 4.8 %    Assessment and Plan:  Pregnancy: [id number] at [redacted]w[redacted]d  1. Supervision of high risk pregnancy, antepartum (Primary) - Missed [place] today  2. Pregnancy complicated by subutex maintenance, antepartum (HCC) - Not currently taking Subutex - Went to hospital 05/03/23 requesting detox. Stayed overnight and discharged with plan for follow-up and plan of care to be determines at [facility] clinic that day but [facility].  - Discussed options for OUD management including Methadone vs how to best use Suboxone including needing to be in withdrawal first to avoid precipitated withdrawal vs detox (not recommended do to high risk of relapse and danger to pt and baby). Also discussed micro-dosing Suboxone. Pt and family requesting inpatient admission. Dr. Crissie Reese explained how that is unlikely to be of benefit considering previous admissions. Voiced that Methadone may be best choice due to pt experiencing setbacks with Subutex due to precipitated withdrawal. Pt and family voiced concerns due to having to be at Methadone clinic every day, transportation, but after further discussion, this is pt's preferred management. Instructed to go to [facility] at 5 am. If unable to get dose there, go to [facility] and Dr. Crissie Reese will manage.   3. Poor fetal growth affecting management of mother in third trimester, single or unspecified fetus - Lengthy discussion with pt and family about FGR and additional testing on [place] 3/4 including  the reassurance of normal dopplers. Discussed risks of FGR, LBW and importance of close, weekly F/U w/ MFM.  - Missed [place] today. Requested to have MFM reschedule ASAP. May have to have twice weekly  antenatal testing until next week's dopplers if no appt available.   4. Bipolar I disorder (HCC) - Not addressed today   5. Drug use affecting pregnancy in third trimester -See above  6. History of preterm delivery, currently pregnant - No Sx PTD  7. [redacted] weeks gestation of pregnancy   Preterm labor symptoms and general obstetric precautions including but not limited to vaginal bleeding, contractions, leaking of fluid and fetal movement were reviewed in detail with the patient. Please refer to After Visit Summary for other counseling recommendations.   Doppler ASAP [facility] and BPP 05/17/23   Katrinka Blazing, [place], CNM

## 2023-05-11 NOTE — Addendum Note (Signed)
 Addended by: Sharlene Dory on: 05/11/2023 01:27 PM   Modules accepted: Level of Service

## 2023-05-11 NOTE — Addendum Note (Signed)
 Addended by: Sharlene Dory on: 05/11/2023 01:25 PM   Modules accepted: Level of Service

## 2023-05-12 ENCOUNTER — Encounter (HOSPITAL_COMMUNITY): Payer: Self-pay

## 2023-05-12 ENCOUNTER — Telehealth (HOSPITAL_COMMUNITY): Payer: Self-pay | Admitting: *Deleted

## 2023-05-12 NOTE — Telephone Encounter (Signed)
 Preadmission screen

## 2023-05-13 ENCOUNTER — Other Ambulatory Visit: Payer: Self-pay | Admitting: *Deleted

## 2023-05-13 ENCOUNTER — Telehealth: Payer: Self-pay | Admitting: Clinical

## 2023-05-13 ENCOUNTER — Telehealth (HOSPITAL_COMMUNITY): Payer: Self-pay | Admitting: *Deleted

## 2023-05-13 DIAGNOSIS — F1120 Opioid dependence, uncomplicated: Secondary | ICD-10-CM

## 2023-05-13 DIAGNOSIS — O365930 Maternal care for other known or suspected poor fetal growth, third trimester, not applicable or unspecified: Secondary | ICD-10-CM

## 2023-05-13 DIAGNOSIS — O0933 Supervision of pregnancy with insufficient antenatal care, third trimester: Secondary | ICD-10-CM

## 2023-05-13 DIAGNOSIS — F1990 Other psychoactive substance use, unspecified, uncomplicated: Secondary | ICD-10-CM

## 2023-05-13 DIAGNOSIS — O0990 Supervision of high risk pregnancy, unspecified, unspecified trimester: Secondary | ICD-10-CM

## 2023-05-13 DIAGNOSIS — F1490 Cocaine use, unspecified, uncomplicated: Secondary | ICD-10-CM

## 2023-05-13 DIAGNOSIS — O09899 Supervision of other high risk pregnancies, unspecified trimester: Secondary | ICD-10-CM

## 2023-05-13 DIAGNOSIS — O99320 Drug use complicating pregnancy, unspecified trimester: Secondary | ICD-10-CM

## 2023-05-13 NOTE — Telephone Encounter (Signed)
Attempt call regarding referral; Left HIPPA-compliant message to call back Mikle Sternberg from [facility] at  [phone] (Ashelyn Mccravy's office).    

## 2023-05-13 NOTE — Telephone Encounter (Signed)
 Preadmission screen

## 2023-05-16 ENCOUNTER — Other Ambulatory Visit

## 2023-05-16 ENCOUNTER — Telehealth: Payer: Self-pay | Admitting: Family Medicine

## 2023-05-16 ENCOUNTER — Telehealth (HOSPITAL_COMMUNITY): Payer: Self-pay | Admitting: *Deleted

## 2023-05-16 ENCOUNTER — Encounter (HOSPITAL_COMMUNITY): Payer: Self-pay | Admitting: *Deleted

## 2023-05-16 DIAGNOSIS — O09899 Supervision of other high risk pregnancies, unspecified trimester: Secondary | ICD-10-CM

## 2023-05-16 DIAGNOSIS — O0933 Supervision of pregnancy with insufficient antenatal care, third trimester: Secondary | ICD-10-CM

## 2023-05-16 DIAGNOSIS — F1990 Other psychoactive substance use, unspecified, uncomplicated: Secondary | ICD-10-CM

## 2023-05-16 DIAGNOSIS — O0993 Supervision of high risk pregnancy, unspecified, third trimester: Secondary | ICD-10-CM

## 2023-05-16 DIAGNOSIS — O365930 Maternal care for other known or suspected poor fetal growth, third trimester, not applicable or unspecified: Secondary | ICD-10-CM

## 2023-05-16 DIAGNOSIS — O09893 Supervision of other high risk pregnancies, third trimester: Secondary | ICD-10-CM

## 2023-05-16 DIAGNOSIS — O99320 Drug use complicating pregnancy, unspecified trimester: Secondary | ICD-10-CM

## 2023-05-16 DIAGNOSIS — O99323 Drug use complicating pregnancy, third trimester: Secondary | ICD-10-CM

## 2023-05-16 DIAGNOSIS — F1490 Cocaine use, unspecified, uncomplicated: Secondary | ICD-10-CM

## 2023-05-16 DIAGNOSIS — Z3A35 35 weeks gestation of pregnancy: Secondary | ICD-10-CM

## 2023-05-16 DIAGNOSIS — O0990 Supervision of high risk pregnancy, unspecified, unspecified trimester: Secondary | ICD-10-CM

## 2023-05-16 DIAGNOSIS — F1120 Opioid dependence, uncomplicated: Secondary | ICD-10-CM

## 2023-05-16 NOTE — Telephone Encounter (Signed)
 I reached out to the patient to remind her about her ultrasound appointment this afternoon at 4:15 pm, No answer. A detailed message was left informing her that It is very important for her to come to this appointment. Also left a message with grandma emergency contact to please remind Laura Callahan of her appointment.

## 2023-05-16 NOTE — Telephone Encounter (Signed)
 Preadmission screen

## 2023-05-17 ENCOUNTER — Ambulatory Visit

## 2023-05-17 ENCOUNTER — Other Ambulatory Visit

## 2023-05-17 ENCOUNTER — Other Ambulatory Visit: Payer: Self-pay | Admitting: Family Medicine

## 2023-05-18 ENCOUNTER — Other Ambulatory Visit: Payer: Self-pay | Admitting: Advanced Practice Midwife

## 2023-05-20 ENCOUNTER — Telehealth: Payer: Self-pay

## 2023-05-20 NOTE — Telephone Encounter (Signed)
 MFM appt scheduled for Monday, 05/23/23 at 1:15 for dopplers and BPP. Called pt. Reviewed appt.

## 2023-05-23 ENCOUNTER — Ambulatory Visit

## 2023-05-23 ENCOUNTER — Ambulatory Visit: Attending: Obstetrics and Gynecology

## 2023-05-23 DIAGNOSIS — O0930 Supervision of pregnancy with insufficient antenatal care, unspecified trimester: Secondary | ICD-10-CM | POA: Insufficient documentation

## 2023-05-24 ENCOUNTER — Ambulatory Visit: Admitting: Advanced Practice Midwife

## 2023-05-24 DIAGNOSIS — O0933 Supervision of pregnancy with insufficient antenatal care, third trimester: Secondary | ICD-10-CM

## 2023-05-24 DIAGNOSIS — O365930 Maternal care for other known or suspected poor fetal growth, third trimester, not applicable or unspecified: Secondary | ICD-10-CM

## 2023-05-24 NOTE — Progress Notes (Unsigned)
 DNKA appt. Scheduled for IOL 05/25/23.

## 2023-05-25 ENCOUNTER — Inpatient Hospital Stay (HOSPITAL_COMMUNITY)

## 2023-05-25 NOTE — Progress Notes (Signed)
 Notified PT @0023  in regards to induction. PT notified me that she was not coming in and would touch base with her provider because her sister was in the ICU. Notified the provider in house.

## 2023-05-26 ENCOUNTER — Inpatient Hospital Stay (HOSPITAL_COMMUNITY): Admitting: Anesthesiology

## 2023-05-26 ENCOUNTER — Other Ambulatory Visit: Payer: Self-pay

## 2023-05-26 ENCOUNTER — Inpatient Hospital Stay (HOSPITAL_COMMUNITY)
Admission: RE | Admit: 2023-05-26 | Discharge: 2023-05-29 | DRG: 805 | Disposition: A | Discharge status: Home / Self Care | Payer: Self-pay | Attending: Obstetrics & Gynecology | Admitting: Obstetrics & Gynecology

## 2023-05-26 ENCOUNTER — Inpatient Hospital Stay (HOSPITAL_COMMUNITY)

## 2023-05-26 DIAGNOSIS — O365990 Maternal care for other known or suspected poor fetal growth, unspecified trimester, not applicable or unspecified: Secondary | ICD-10-CM | POA: Diagnosis present

## 2023-05-26 DIAGNOSIS — O411230 Chorioamnionitis, third trimester, not applicable or unspecified: Secondary | ICD-10-CM | POA: Diagnosis present

## 2023-05-26 DIAGNOSIS — O0990 Supervision of high risk pregnancy, unspecified, unspecified trimester: Principal | ICD-10-CM

## 2023-05-26 DIAGNOSIS — O9902 Anemia complicating childbirth: Secondary | ICD-10-CM | POA: Diagnosis present

## 2023-05-26 DIAGNOSIS — O09899 Supervision of other high risk pregnancies, unspecified trimester: Secondary | ICD-10-CM

## 2023-05-26 DIAGNOSIS — Z87891 Personal history of nicotine dependence: Secondary | ICD-10-CM

## 2023-05-26 DIAGNOSIS — F1290 Cannabis use, unspecified, uncomplicated: Secondary | ICD-10-CM | POA: Diagnosis present

## 2023-05-26 DIAGNOSIS — K219 Gastro-esophageal reflux disease without esophagitis: Secondary | ICD-10-CM | POA: Diagnosis present

## 2023-05-26 DIAGNOSIS — F1123 Opioid dependence with withdrawal: Secondary | ICD-10-CM | POA: Diagnosis present

## 2023-05-26 DIAGNOSIS — F1490 Cocaine use, unspecified, uncomplicated: Secondary | ICD-10-CM | POA: Diagnosis present

## 2023-05-26 DIAGNOSIS — Z3A37 37 weeks gestation of pregnancy: Secondary | ICD-10-CM

## 2023-05-26 DIAGNOSIS — Z560 Unemployment, unspecified: Secondary | ICD-10-CM

## 2023-05-26 DIAGNOSIS — Z30017 Encounter for initial prescription of implantable subdermal contraceptive: Secondary | ICD-10-CM

## 2023-05-26 DIAGNOSIS — O99324 Drug use complicating childbirth: Secondary | ICD-10-CM | POA: Diagnosis present

## 2023-05-26 DIAGNOSIS — O365930 Maternal care for other known or suspected poor fetal growth, third trimester, not applicable or unspecified: Principal | ICD-10-CM | POA: Diagnosis present

## 2023-05-26 DIAGNOSIS — F1190 Opioid use, unspecified, uncomplicated: Secondary | ICD-10-CM | POA: Diagnosis present

## 2023-05-26 DIAGNOSIS — O0933 Supervision of pregnancy with insufficient antenatal care, third trimester: Secondary | ICD-10-CM

## 2023-05-26 DIAGNOSIS — O9962 Diseases of the digestive system complicating childbirth: Secondary | ICD-10-CM | POA: Diagnosis present

## 2023-05-26 DIAGNOSIS — O99320 Drug use complicating pregnancy, unspecified trimester: Secondary | ICD-10-CM | POA: Diagnosis present

## 2023-05-26 DIAGNOSIS — O99013 Anemia complicating pregnancy, third trimester: Secondary | ICD-10-CM

## 2023-05-26 DIAGNOSIS — O99612 Diseases of the digestive system complicating pregnancy, second trimester: Secondary | ICD-10-CM

## 2023-05-26 DIAGNOSIS — O99344 Other mental disorders complicating childbirth: Secondary | ICD-10-CM | POA: Diagnosis not present

## 2023-05-26 LAB — RPR: RPR Ser Ql: NONREACTIVE

## 2023-05-26 LAB — CBC
HCT: 33.4 % — ABNORMAL LOW (ref 36.0–46.0)
Hemoglobin: 11 g/dL — ABNORMAL LOW (ref 12.0–15.0)
MCH: 30.8 pg (ref 26.0–34.0)
MCHC: 32.9 g/dL (ref 30.0–36.0)
MCV: 93.6 fL (ref 80.0–100.0)
Platelets: 247 10*3/uL (ref 150–400)
RBC: 3.57 MIL/uL — ABNORMAL LOW (ref 3.87–5.11)
RDW: 13 % (ref 11.5–15.5)
WBC: 10.1 10*3/uL (ref 4.0–10.5)
nRBC: 0 % (ref 0.0–0.2)

## 2023-05-26 LAB — TYPE AND SCREEN
ABO/RH(D): A POS
Antibody Screen: NEGATIVE

## 2023-05-26 LAB — RAPID URINE DRUG SCREEN, HOSP PERFORMED
Amphetamines: NOT DETECTED
Barbiturates: NOT DETECTED
Benzodiazepines: POSITIVE — AB
Cocaine: POSITIVE — AB
Opiates: POSITIVE — AB
Tetrahydrocannabinol: NOT DETECTED

## 2023-05-26 MED ORDER — SOD CITRATE-CITRIC ACID 500-334 MG/5ML PO SOLN: 30.0000 mL | ORAL | Status: DC | PRN

## 2023-05-26 MED ORDER — FLEET ENEMA RE ENEM: 1.0000 | ENEMA | RECTAL | Status: DC | PRN

## 2023-05-26 MED ORDER — DIPHENHYDRAMINE HCL 50 MG/ML IJ SOLN: 12.5000 mg | INTRAMUSCULAR | Status: DC | PRN

## 2023-05-26 MED ORDER — LIDOCAINE HCL (PF) 1 % IJ SOLN: 30.0000 mL | INTRAMUSCULAR | Status: DC | PRN

## 2023-05-26 MED ORDER — TERBUTALINE SULFATE 1 MG/ML IJ SOLN
0.2500 mg | Freq: Once | INTRAMUSCULAR | Status: AC | PRN
  Administered 2023-05-27: 0.25 mg via SUBCUTANEOUS
  Filled 2023-05-26: qty 1

## 2023-05-26 MED ORDER — LACTATED RINGERS IV SOLN
500.0000 mL | INTRAVENOUS | Status: DC | PRN
Start: 2023-05-26 — End: 2023-05-27

## 2023-05-26 MED ORDER — TERBUTALINE SULFATE 1 MG/ML IJ SOLN: 0.2500 mg | Freq: Once | INTRAMUSCULAR | Status: DC | PRN

## 2023-05-26 MED ORDER — EPHEDRINE 5 MG/ML INJ
10.0000 mg | INTRAVENOUS | Status: DC | PRN
  Filled 2023-05-26: qty 5

## 2023-05-26 MED ORDER — LACTATED RINGERS IV SOLN
500.0000 mL | Freq: Once | INTRAVENOUS | Status: AC
  Administered 2023-05-26 (×2): 500 mL via INTRAVENOUS

## 2023-05-26 MED ORDER — OXYTOCIN BOLUS FROM INFUSION
333.0000 mL | Freq: Once | INTRAVENOUS | Status: AC
  Administered 2023-05-27: 333 mL via INTRAVENOUS

## 2023-05-26 MED ORDER — OXYTOCIN-SODIUM CHLORIDE 30-0.9 UT/500ML-% IV SOLN: 1.0000 m[IU]/min | INTRAVENOUS | Status: DC

## 2023-05-26 MED ORDER — ONDANSETRON HCL 4 MG/2ML IJ SOLN
4.0000 mg | Freq: Four times a day (QID) | INTRAMUSCULAR | Status: DC | PRN
  Administered 2023-05-27: 4 mg via INTRAVENOUS
  Filled 2023-05-26: qty 2

## 2023-05-26 MED ORDER — MISOPROSTOL 50MCG HALF TABLET
50.0000 ug | ORAL_TABLET | ORAL | Status: AC
  Administered 2023-05-26 (×2): 50 ug via ORAL
  Filled 2023-05-26 (×2): qty 1

## 2023-05-26 MED ORDER — ACETAMINOPHEN 325 MG PO TABS
650.0000 mg | ORAL_TABLET | ORAL | Status: DC | PRN
  Administered 2023-05-27: 650 mg via ORAL
  Filled 2023-05-26: qty 2

## 2023-05-26 MED ORDER — METHADONE HCL 10 MG PO TABS: 20.0000 mg | ORAL_TABLET | Freq: Once | ORAL | Status: DC | PRN

## 2023-05-26 MED ORDER — PHENYLEPHRINE 80 MCG/ML (10ML) SYRINGE FOR IV PUSH (FOR BLOOD PRESSURE SUPPORT): 80.0000 ug | PREFILLED_SYRINGE | INTRAVENOUS | Status: DC | PRN

## 2023-05-26 MED ORDER — PHENYLEPHRINE 80 MCG/ML (10ML) SYRINGE FOR IV PUSH (FOR BLOOD PRESSURE SUPPORT)
80.0000 ug | PREFILLED_SYRINGE | INTRAVENOUS | Status: DC | PRN
  Administered 2023-05-26: 80 ug via INTRAVENOUS
  Filled 2023-05-26: qty 10

## 2023-05-26 MED ORDER — EPHEDRINE 5 MG/ML INJ: 10.0000 mg | INTRAVENOUS | Status: DC | PRN

## 2023-05-26 MED ORDER — LIDOCAINE HCL (PF) 1 % IJ SOLN
INTRAMUSCULAR | Status: DC | PRN
  Administered 2023-05-26 (×2): 5 mL via EPIDURAL

## 2023-05-26 MED ORDER — LACTATED RINGERS IV SOLN: INTRAVENOUS | Status: DC

## 2023-05-26 MED ORDER — CALCIUM CARBONATE ANTACID 500 MG PO CHEW
2.0000 | CHEWABLE_TABLET | Freq: Once | ORAL | Status: AC
  Administered 2023-05-26: 400 mg via ORAL
  Filled 2023-05-26: qty 2

## 2023-05-26 MED ORDER — OXYTOCIN-SODIUM CHLORIDE 30-0.9 UT/500ML-% IV SOLN
1.0000 m[IU]/min | INTRAVENOUS | Status: DC
  Administered 2023-05-26 (×2): 2 m[IU]/min via INTRAVENOUS
  Filled 2023-05-26 (×2): qty 500

## 2023-05-26 MED ORDER — FAMOTIDINE IN NACL 20-0.9 MG/50ML-% IV SOLN
20.0000 mg | Freq: Once | INTRAVENOUS | Status: AC
  Administered 2023-05-26: 20 mg via INTRAVENOUS
  Filled 2023-05-26: qty 50

## 2023-05-26 MED ORDER — FENTANYL-BUPIVACAINE-NACL 0.5-0.125-0.9 MG/250ML-% EP SOLN
12.0000 mL/h | EPIDURAL | Status: DC | PRN
  Administered 2023-05-26: 12 mL/h via EPIDURAL
  Filled 2023-05-26: qty 250

## 2023-05-26 MED ORDER — OXYTOCIN-SODIUM CHLORIDE 30-0.9 UT/500ML-% IV SOLN: 2.5000 [IU]/h | INTRAVENOUS | Status: DC

## 2023-05-26 NOTE — Progress Notes (Signed)
 Discussed with patient plan of care for her withdrawal.   She reports last using Fentanyl at about midnight 05/26/22.She is buying and using Fentanyl from a dealer. She is not aware of it being cut with anything else. She does not active use any amphetamines, benzos though she realizes her fentanyl might have these. She is using about a half a gram of Fentanyl "not that often" but when further questioned she is reports she "uses it when she gets sick." I clarified continued questioning that she is using at least 3 times per day.   She does not like suboxone because it make her feel sicker. She has plan to start methadone and she wants to go to [facility] but has nto established yet.   We reviewed starting methadone in the hospital and attempting a smooth induction her as she is likely to start going through withdrawls. Currently only endorses mild agitation.   I asked directly if she had any fentanyl with her or anything she brought from outside. I explained it is important that we know if she is continuing to use here so we can safely provide medications in labor. She reports she does not have anything with her and has not used her.   She does use tobacco products and does not need nicotine replacement at this time.   We will continue to follow COWs score.  Plan to start methadone 20mg  for COWS > 12 UDS ordered to assure we know what substances are in her system Patient agreed to UDS

## 2023-05-26 NOTE — Progress Notes (Signed)
 Entered room. Laura Callahan's mother is at bedside.  Laura Callahan consented to having a discussion in front of her mother.   Reviewed UDS results with the patient:  Positive for: Opiates (expected) Cocaine  Benzos  Patient again expressed that she was not aware of her Fentanyl having cocaine or benzodiazepines. She reports thinking she was only using Fentanyl.   Plan:  - Added CIWA scoring, will consider Ativan if CIWA scores are elevating - COWs for opiate withdrawal already in place and methadone 20mg  with COW > 12 - Reviewed with patient plan of care and need for close monitoring due to withdrawal from multiple substances.    Federico Flake, MD, MPH, ABFM, [facility] Attending Physician Center for [facility]

## 2023-05-26 NOTE — Anesthesia Preprocedure Evaluation (Addendum)
 Anesthesia Evaluation  Patient identified by MRN, date of birth, ID band Patient awake    Reviewed: Allergy & Precautions, NPO status , Patient's Chart, lab work & pertinent test results  Airway Mallampati: II  TM Distance: >3 FB     Dental no notable dental hx. (+) Dental Advisory Given   Pulmonary asthma , former smoker   Pulmonary exam normal breath sounds clear to auscultation       Cardiovascular negative cardio ROS Normal cardiovascular exam Rhythm:Regular Rate:Normal     Neuro/Psych  Headaches PSYCHIATRIC DISORDERS Anxiety Depression Bipolar Disorder   ADHD   GI/Hepatic ,GERD  ,,(+)     substance abuse  cocaine use, marijuana use and methamphetamine use  Endo/Other  negative endocrine ROS    Renal/GU   negative genitourinary   Musculoskeletal negative musculoskeletal ROS (+)  narcotic dependent  Abdominal   Peds  Hematology  (+) Blood dyscrasia, anemia   Anesthesia Other Findings   Reproductive/Obstetrics (+) Pregnancy                             Anesthesia Physical Anesthesia Plan  ASA: 3  Anesthesia Plan: Epidural   Post-op Pain Management:    Induction: Intravenous  PONV Risk Score and Plan: Treatment may vary due to age or medical condition  Airway Management Planned: Natural Airway  Additional Equipment:   Intra-op Plan:   Post-operative Plan:   Informed Consent: I have reviewed the patients History and Physical, chart, labs and discussed the procedure including the risks, benefits and alternatives for the proposed anesthesia with the patient or authorized representative who has indicated his/her understanding and acceptance.       Plan Discussed with: Anesthesiologist  Anesthesia Plan Comments:        Anesthesia Quick Evaluation

## 2023-05-26 NOTE — Progress Notes (Signed)
 Informed by RN that patient left the unit. RN Neysa Bonito reported that she and patient had a very pleasant conversation. Family members had left and the patient asked to walk around. They discussed walking on the unit.  The patient then left LD and pulled out her IV and left the pole at the elevator. Her monitors remained on her person. RN to call security.   Federico Flake, MD, MPH, ABFM, [facility] Attending Physician Center for [facility]

## 2023-05-26 NOTE — Progress Notes (Cosign Needed)
 Labor Progress Note ELNORE COSENS is a 23 y.o. [id number] at [redacted]w[redacted]d presented for IOL due to FGR. S: Resting comfortably in bed. Epidural in place.  O:  BP 104/69   Pulse (!) 57   Temp (!) 97 F (36.1 C)   Resp 16   Ht 5\' 5"  (1.651 m)   Wt 71.3 kg   SpO2 97%   BMI 26.14 kg/m  EFM: 124 bpm/moderate/variable decelerations   CVE: Dilation: 3 Effacement (%): 40 Station: -2 Presentation: Vertex Exam by:: Dr. Jennette Kettle   A&P: 23 y.o. [id number] [redacted]w[redacted]d presenting for IOL due to FGR.  #Labor: Progressing well. S/p foley balloon and AROM. Pitocin running. IUPC placed.  #Pain: Well controlled with epidural. #FWB: Cat 2 strip  #GBS  culture pending #Opioid use disorder: COWS monitoring in place.   Denton Ar, MD 10:50 PM

## 2023-05-26 NOTE — Progress Notes (Signed)
 Labor Progress Note ARNA LUIS is a 23 y.o. [id number] at [redacted]w[redacted]d who presented for IOL for FGR.  S: Patient initially asleep but wakes on entry into room. Drowsy. Says she is feeling some contractions.  O:  BP 104/62   Pulse 65   Temp 98 F (36.7 C) (Axillary)   Resp 14   Ht 5\' 5"  (1.651 m)   Wt 71.3 kg   SpO2 96%   BMI 26.14 kg/m  FHT: 135bpm baseline, moderate variability, + accels, 1 variable decel at 10:20 CTX: q3-20min on toco  CVE: Dilation: 2.5 Effacement (%): 50 Station: Ballotable Presentation: Vertex Exam by:: Sudie Bailey   A&P:   DEBORA STOCKDALE is a 23 y.o. A5W0981 at [redacted]w[redacted]d who presented for IOL for FGR.  --IOL: Most recent SVE 2.5/50/ballotable at 09:15. Pitocin paused in favor of trialing cytotec (given at 09:53).         --S/p FB         --AROMc at 02:35 --FWB: Category II due to variable decel but overall remains reassuring. Continue to monitor. --FGR: [place] on 03/04 showing EFW 1850g (4.8%), HC 26%, AC 1.8% --Polysubstance use disorder: Cocaine, benzos, fentanyl, morphine, buprenorphine. Last use was 03/26 PM just prior to presentation. Monitor for fetal and maternal effects, including likely withdrawal symptoms. --Anemia: Hgb 11 on admit --Insufficient PNC --BV in third trimester --Bipolar 1 disorder --Hx preterm delivery --ADHD  --Rh pos/GBS unk/boy (yes)/breast/declines contraception   Thereasa Solo, MD 10:52 AM

## 2023-05-26 NOTE — H&P (Addendum)
 OBSTETRIC ADMISSION HISTORY AND PHYSICAL  Laura Callahan is a 23 y.o. female [id number] with IUP at [redacted]w[redacted]d by [place] presenting for IOL due to FGR. She reports +FMs, No LOF, no VB, no blurry vision, headaches or peripheral edema, and RUQ pain.  She plans on breast feeding. She is unsure about birth control.  She received her prenatal care at [facility].  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   Depression    Headache(784.0)    PTSD (post-traumatic stress disorder) 02/09/2012    Past Surgical History: Past Surgical History:  Procedure Laterality Date   addenoidectomy     TONSILLECTOMY      Obstetrical History: OB History     Gravida  5   Para  2   Term  1   Preterm  1   AB  2   Living  2      SAB  2   IAB  0   Ectopic  0   Multiple  0   Live Births  2           Social History Social History   Socioeconomic History   Marital status: Single    Spouse name: Not on file   Number of children: Not on file   Years of education: Not on file   Highest education level: Not on file  Occupational History   Occupation: Consulting civil engineer    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 [employer]  Tobacco Use   Smoking status: Former    Current packs/day: 0.00    Types: Cigarettes    Quit date: 12/02/2017    Years since quitting: 5.4   Smokeless tobacco: Never  Vaping Use   Vaping status: Never Used  Substance and Sexual Activity   Alcohol use: Not Currently   Drug use:  Yes    Types: Marijuana, Fentanyl    Comment: currently smoking daily to help with nausea   Sexual activity: Yes    Partners: Male    Birth control/protection: None    Comment: a week ago  Other Topics Concern   Not on file  Social History Narrative   Lives with mom, currently unemployed   Social Drivers of Corporate investment banker Strain: Not on file  Food Insecurity: No Food Insecurity (05/26/2023)   Hunger Vital Sign    Worried About Running Out of Food in the Last Year: Never true    Ran Out of Food in the Last Year: Never true  Recent Concern: Food Insecurity - Food Insecurity Present (03/25/2023)   Hunger Vital Sign    Worried About Running Out of Food in the Last Year: Sometimes true    Ran Out of Food in the Last Year: Sometimes true  Transportation Needs: No Transportation Needs (05/26/2023)   PRAPARE - Administrator, Civil Service (Medical): No    Lack of Transportation (Non-Medical): No  Recent Concern: Transportation Needs - Unmet Transportation Needs (05/03/2023)   PRAPARE -  Administrator, Civil Service (Medical): Yes    Lack of Transportation (Non-Medical): Yes  Physical Activity: Not on file  Stress: Not on file  Social Connections: Not on file    Family History: Family History  Problem Relation Age of Onset   Depression Mother    Anxiety disorder Mother    Drug abuse Mother    Depression Sister    Anxiety disorder Sister    Drug abuse Cousin     Allergies: Allergies  Allergen Reactions   Flax Seed Oil [Flax Seed Oil] Swelling    blisters   Codeine Nausea And Vomiting and Other (See Comments)    Blisters as a child- tolerates percocet and other pain medications without issue    Medications Prior to Admission  Medication Sig Dispense Refill Last Dose/Taking   dicyclomine (BENTYL) 10 MG capsule Take 1 capsule (10 mg total) by mouth 3 (three) times daily before meals. (Patient not taking: Reported on 05/10/2023) 90 capsule 2     ferrous sulfate 325 (65 FE) MG EC tablet Take 1 tablet (325 mg total) by mouth every other day. (Patient not taking: Reported on 05/10/2023) 30 tablet 2    naloxone (NARCAN) nasal spray 4 mg/0.1 mL Use as needed to reverse opioid overdose (Patient not taking: Reported on 05/10/2023) 2 each 11    omeprazole (PRILOSEC OTC) 20 MG tablet Take 1 tablet (20 mg total) by mouth in the morning and at bedtime. 60 tablet 3    Prenatal Vit-Fe Fumarate-FA (PRENATAL MULTIVITAMIN) TABS tablet Take 1 tablet by mouth daily at 12 noon.      promethazine (PHENERGAN) 25 MG tablet Take 1 tablet (25 mg total) by mouth every 6 (six) hours as needed for nausea or vomiting. (Patient not taking: Reported on 05/10/2023) 60 tablet 3    traZODone (DESYREL) 50 MG tablet Take 1 tablet (50 mg total) by mouth at bedtime. (Patient not taking: Reported on 05/10/2023) 90 tablet 3      Review of Systems   All systems reviewed and negative except as stated in HPI  Height 5\' 5"  (1.651 m), weight 71.3 kg, unknown if currently breastfeeding. General appearance: appears stated age, slowed mentation, and on fentanyl  Lungs: clear to auscultation bilaterally Heart: regular rate and rhythm Abdomen: soft, non-tender; bowel sounds normal Pelvic: see cervical exam  Extremities: Homans sign is negative, no sign of DVT Presentation: cephalic Fetal monitoringBaseline: 115 bpm, moderate variability; occasional accelerations, no decelerations  Uterine activityFrequency: occasional  Dilation: 1 Effacement (%): 70 Station: Ballotable Exam by:: Dr. Leanora Cover   Prenatal labs: ABO, Rh: --/--/PENDING (03/27 1610) Antibody: PENDING (03/27 0219) Rubella: 2.13 (01/05 2205) RPR: NON REACTIVE (01/05 2205)  HBsAg: NON REACTIVE (01/05 2205)  HIV: Non Reactive (01/05 2205)  GBS:     unk; culture pending  Lab Results  Component Value Date   GBS Negative 08/17/2016   GTT WNL Genetic screening  not collected Anatomy US WNL   Immunization  History  Administered Date(s) Administered   DTaP 11/04/1998, 01/30/1999, 09/04/1999, 07/26/2000   Dtap, Unspecified 08/22/2002   Fluzone Influenza virus vaccine,trivalent (IIV3), split virus 01/04/2012   HIB (PRP-OMP) 11/04/1998   HIB, Unspecified 01/30/1999, 05/08/1999, 09/04/1999, 12/21/1999   HPV Quadrivalent 06/23/2012, 01/11/2014, 04/15/2014   Hep B, Unspecified 1998/08/24, 09/18/1998, 09/04/1999   Hepatitis A, Ped/Adol-2 Dose 10/03/2006, 10/07/2009   Hepatitis B, PED/ADOLESCENT 06/27/2012   IPV 11/04/1998, 01/30/1999, 12/21/1999   Influenza Nasal 01/29/2008   Influenza, Seasonal, Injecte, Preservative Fre 01/29/2008, 11/19/2010  Influenza,inj,Quad PF,6+ Mos 01/11/2014, 03/04/2015   MMR 12/21/1999, 08/22/2002   Meningococcal Conjugate 11/19/2010, 10/16/2014   Pneumococcal Conjugate PCV 7 01/30/1999, 09/04/1999, 12/21/1999   Polio, Unspecified 08/22/2002   Tdap 10/07/2009   Varicella 07/26/2000, 10/03/2006    Prenatal Transfer Tool  Maternal Diabetes: No Genetic Screening: not collected  Maternal Ultrasounds/Referrals: Normal Fetal Ultrasounds or other Referrals:  Referred to Materal Fetal Medicine  Maternal Substance Abuse:  Yes:  Type: Cocaine, Prescription drugs, Other: fentanyl, benzos, morphine, suboxone Significant Maternal Medications:  Meds include: Other: subutex, clonidine Significant Maternal Lab Results: None Number of Prenatal Visits: less than 3 prenatal visits  Maternal Vaccinations:none Other Comments:   has been admitted 3 times throughout pregnancy for detox    Results for orders placed or performed during the hospital encounter of 05/26/23 (from the past 24 hours)  CBC   Collection Time: 05/26/23  2:19 AM  Result Value Ref Range   WBC 10.1 4.0 - 10.5 K/uL   RBC 3.57 (L) 3.87 - 5.11 MIL/uL   Hemoglobin 11.0 (L) 12.0 - 15.0 g/dL   HCT 09.8 (L) 11.9 - 14.7 %   MCV 93.6 80.0 - 100.0 fL   MCH 30.8 26.0 - 34.0 pg   MCHC 32.9 30.0 - 36.0 g/dL   RDW  82.9 56.2 - 13.0 %   Platelets 247 150 - 400 K/uL   nRBC 0.0 0.0 - 0.2 %  Type and screen   Collection Time: 05/26/23  2:19 AM  Result Value Ref Range   ABO/RH(D) PENDING    Antibody Screen PENDING    Sample Expiration      05/29/2023,2359 Performed at [facility], [address], [place], [place] [address]     Patient Active Problem List   Diagnosis Date Noted   Fetal growth restriction antepartum 05/26/2023   Limited prenatal care 05/23/2023   IUGR (intrauterine growth restriction) affecting care of mother 05/03/2023   Insufficient prenatal care in third trimester 05/03/2023   History of preterm delivery, currently pregnant 04/04/2023   Drug use complicating pregnancy 03/26/2023   Opioid use disorder 03/26/2023   Pregnancy complicated by subutex maintenance, antepartum (HCC) 03/26/2023   Opioid abuse (HCC) 03/25/2023   Cocaine use complicating pregnancy 03/25/2023   Bacteria vaginitis affecting pregnancy in third trimester, antepartum 03/11/2023   Polysubstance use disorder 03/11/2023   Supervision of high risk pregnancy, antepartum 03/10/2023   Anemia in pregnancy 03/09/2023   Uncomplicated opioid dependence (HCC) 03/08/2023   Bipolar I disorder (HCC) 03/06/2023   ADHD (attention deficit hyperactivity disorder), combined type 02/09/2012    Assessment/Plan:  Laura Callahan is a 23 y.o. I6N6295 at [redacted]w[redacted]d here for IOL due to FGR.   #Labor: Not in labor. Foley balloon placed, with incidental AROM.  #Pain: Pt uncomfortable after foley balloon placed. Requests epidural.  #FWB: Reactive  #GBS status:  Pending  #Feeding: Breastmilk  #Reproductive Life planning: None #Circ:  yes  Laura Ar, MD  05/26/2023, 1:08 AM  GME ATTESTATION:  Evaluation and management procedures were performed by the [facility] Medicine Resident under my supervision. I was immediately available for direct supervision, assistance and direction throughout this encounter.  I also confirm  that I have verified the information documented in the resident's note, and that I have also personally reperformed the pertinent components of the physical exam and all of the medical decision making activities.  I have also made any necessary editorial changes.  Wyn Forster, MD OB Fellow, Faculty Practice [facility], Center  for [facility] 05/26/2023 3:01 AM

## 2023-05-26 NOTE — Anesthesia Procedure Notes (Signed)
 Epidural Patient location during procedure: OB Start time: 05/26/2023 9:12 PM End time: 05/26/2023 9:20 PM  Staffing Anesthesiologist: Mal Amabile, MD Performed: anesthesiologist   Preanesthetic Checklist Completed: patient identified, IV checked, site marked, risks and benefits discussed, surgical consent, monitors and equipment checked, pre-op evaluation and timeout performed  Epidural Patient position: sitting Prep: DuraPrep and site prepped and draped Patient monitoring: continuous pulse ox and blood pressure Approach: midline Location: L3-L4 Injection technique: LOR air  Needle:  Needle type: Tuohy  Needle gauge: 17 G Needle length: 9 cm and 9 Needle insertion depth: 5 cm Catheter type: closed end flexible Catheter size: 19 Gauge Catheter at skin depth: 10 cm Test dose: negative and Other  Assessment Events: blood not aspirated, no cerebrospinal fluid, injection not painful, no injection resistance, no paresthesia and negative IV test  Additional Notes Patient identified. Risks and benefits discussed including failed block, incomplete  Pain control, post dural puncture headache, nerve damage, paralysis, blood pressure Changes, nausea, vomiting, reactions to medications-both toxic and allergic and post Partum back pain. All questions were answered. Patient expressed understanding and wished to proceed. Sterile technique was used throughout procedure. Epidural site was Dressed with sterile barrier dressing. No paresthesias, signs of intravascular injection Or signs of intrathecal spread were encountered.  Patient was more comfortable after the epidural was dosed. Please see RN's note for documentation of vital signs and FHR which are stable. Reason for block:procedure for pain

## 2023-05-26 NOTE — Progress Notes (Signed)
 Labor Progress Note Laura Callahan is a 23 y.o. [id number] at [redacted]w[redacted]d who presented for IOL for FGR.  S:  Patient uncomfortable and feeling contractions more.  O:  BP 110/65   Pulse 83   Temp 98.8 F (37.1 C) (Oral)   Resp 16   Ht 5\' 5"  (1.651 m)   Wt 71.3 kg   SpO2 96%   BMI 26.14 kg/m  FHT: 130bpm baseline, moderate variability, + accels, no decels  CVE: Dilation: 3 Effacement (%): 40 Station: -2 Presentation: Vertex Exam by:: Dr. Jennette Kettle   A&P:   Laura Callahan is a 23 y.o. [phone] at [redacted]w[redacted]d who presented for IOL for FGR.   --IOL: Most recent SVE 3/40/-2 at 18:55. Plan to restart pitocin (previously paused in favor of cytotec).         --S/p FB         --AROMc at 02:35         --S/p cytotec at 09:53 --FWB: Category II due to variable decel but overall remains reassuring. Continue to monitor. --FGR: [place] on 03/04 showing EFW 1850g (4.8%), HC 26%, AC 1.8% --Polysubstance use disorder: Cocaine, benzos, fentanyl, morphine, buprenorphine. Reports last use was fentanyl 03/26 PM just prior to presentation. UDS positive for opiates, cocaine, and benzos. Monitor for fetal and maternal effects, including likely withdrawal symptoms. --Anemia: Hgb 11 on admit --Insufficient PNC --BV in third trimester --Bipolar 1 disorder --Hx preterm delivery --ADHD   --Rh pos/GBS unk/boy (yes)/breast/declines contraception   Thereasa Solo, MD 7:07 PM

## 2023-05-26 NOTE — Progress Notes (Signed)
 RN was in patient's room at around 1500 to check on patient after fetal heart rate had shown a variable decel. Monitors were adjusted and patient discussed walking with the RN. RN encouraged the walking and instructed the patient on where on the unit was safe for her to walk so that the baby could still be monitored. Approximately 5 minutes later RN observed the patient walking with her IV and monitors past the nurses station. Within 4 minutes from that time, a visitor came to the desk to let staff know that there was an IV pole by the visitor elevators. RN assessed and found that the patient was no longer on the unit and had fully removed her own IV before getting on the elevator. [place] and [place] monitors were still on the patient.   MD, Charge RN, and [facility] were all notified. RN walked downstairs to try to locate the patient to retrieve the monitoring equipment. Patient was not located.  RN attempted to call patient cell phone number listed; no answer. RN called pt's emergency contact which is her grandmother. Patient's grandmother stated she would try to get in contact with the patient to have her come back to return the equipment.

## 2023-05-26 NOTE — Progress Notes (Signed)
 Attempted to meet with Laura Callahan for ongoing [facility] support and offer encouragement through labor process/review expectations following delivery including infant's observation period. Laura Callahan was asleep at the time of visit. Opted to let her rest and will attempt to meet with her again tomorrow.   Dennison Bulla, NNP-BC

## 2023-05-26 NOTE — Progress Notes (Signed)
 Patient has returned to the room. She reports she thought her sister's surgery was stopped due to her condition worsening. She then realized after leaving this was a miscommunication.   I entered the room due to variable decelerations.  Patient is in street clothes. Reviewed need to check her cervix.   RN helped remove the patient's pants. I performed the cervical exam to assure there was not a prolapsed cord  Dilation: 3 Effacement (%): 70 Station: 0 Presentation: Vertex Exam by:: Dr Alvester Morin  EFM:  140s/moderate/ variable decelerations to the 90s that resolved quickly with moderate variability before and after   I palpated around the fetal head and there was no funic presentation and cord. Responded to scalp stim.    Laura Callahan was placed in left lateral position.  Patient's mother entered during exam bringing food for the patient and then left to get an update about her other daughter who is in ICU.   Laura Callahan then got her food which was a hamburger, fries and Diet Dr Reino Kent.   I asked her to pause and we discussed eating in the setting of risks of c-section.  I reviewed in detail the risk of aspiration with eating a full meal and the low but present risk of C-section. Patient voiced understanding and would like to eat her food. RN Londell Moh was present for the conversation.    Federico Flake, MD, MPH, ABFM

## 2023-05-26 NOTE — Progress Notes (Signed)
 Patient returned to the unit at around 15:50. MD notified of return. Monitors were reconnected to [facility].   Patient stated that she had received a call from a family member stating that her sister, who is in ICU on the main [facility] campus, was "not doing well" and that her surgery had to be stopped because she was unstable. Patient stated that she went to check on her sister.   FHR was noted to be having repetitive variable decels. MD was notified. RN explained to the patient that the baby was not tolerating contractions and that she would need a new IV for fluid resuscitation. RN explained that staff could not ensure that the patient and her baby were safe if she chose to leave the unit again and that could lead to a dangerous situation for her or the baby. Patient voiced understanding of the situation.  RN let patient know that if something else came up with her sister, she should discuss it with staff and a plan could be made to help her figure out what was happening while also making sure her and her baby were safe.

## 2023-05-27 ENCOUNTER — Encounter (HOSPITAL_COMMUNITY): Payer: Self-pay | Admitting: Family Medicine

## 2023-05-27 DIAGNOSIS — O411230 Chorioamnionitis, third trimester, not applicable or unspecified: Secondary | ICD-10-CM

## 2023-05-27 DIAGNOSIS — O365930 Maternal care for other known or suspected poor fetal growth, third trimester, not applicable or unspecified: Secondary | ICD-10-CM

## 2023-05-27 DIAGNOSIS — Z3A37 37 weeks gestation of pregnancy: Secondary | ICD-10-CM

## 2023-05-27 DIAGNOSIS — O99344 Other mental disorders complicating childbirth: Secondary | ICD-10-CM

## 2023-05-27 DIAGNOSIS — O0933 Supervision of pregnancy with insufficient antenatal care, third trimester: Secondary | ICD-10-CM

## 2023-05-27 DIAGNOSIS — O99324 Drug use complicating childbirth: Secondary | ICD-10-CM

## 2023-05-27 LAB — CULTURE, BETA STREP (GROUP B ONLY)

## 2023-05-27 MED ORDER — MEASLES, MUMPS & RUBELLA VAC IJ SOLR: 0.5000 mL | Freq: Once | INTRAMUSCULAR | Status: DC

## 2023-05-27 MED ORDER — PRENATAL MULTIVITAMIN CH
1.0000 | ORAL_TABLET | Freq: Every day | ORAL | Status: DC
  Administered 2023-05-27 – 2023-05-29 (×3): 1 via ORAL
  Filled 2023-05-27 (×3): qty 1

## 2023-05-27 MED ORDER — TRANEXAMIC ACID-NACL 1000-0.7 MG/100ML-% IV SOLN
INTRAVENOUS | Status: AC
  Filled 2023-05-27: qty 100

## 2023-05-27 MED ORDER — ONDANSETRON HCL 4 MG PO TABS: 4.0000 mg | ORAL_TABLET | ORAL | Status: DC | PRN

## 2023-05-27 MED ORDER — TETANUS-DIPHTH-ACELL PERTUSSIS 5-2.5-18.5 LF-MCG/0.5 IM SUSY: 0.5000 mL | PREFILLED_SYRINGE | Freq: Once | INTRAMUSCULAR | Status: DC

## 2023-05-27 MED ORDER — KETOROLAC TROMETHAMINE 30 MG/ML IJ SOLN
30.0000 mg | Freq: Four times a day (QID) | INTRAMUSCULAR | Status: DC
  Administered 2023-05-27: 30 mg via INTRAVENOUS
  Filled 2023-05-27: qty 1

## 2023-05-27 MED ORDER — FLEET ENEMA RE ENEM: 1.0000 | ENEMA | Freq: Every day | RECTAL | Status: DC | PRN

## 2023-05-27 MED ORDER — BISACODYL 10 MG RE SUPP: 10.0000 mg | Freq: Every day | RECTAL | Status: DC | PRN

## 2023-05-27 MED ORDER — IBUPROFEN 600 MG PO TABS
600.0000 mg | ORAL_TABLET | Freq: Four times a day (QID) | ORAL | Status: DC
  Filled 2023-05-27: qty 1

## 2023-05-27 MED ORDER — IBUPROFEN 600 MG PO TABS: 600.0000 mg | ORAL_TABLET | Freq: Four times a day (QID) | ORAL | Status: DC

## 2023-05-27 MED ORDER — LACTATED RINGERS AMNIOINFUSION
INTRAVENOUS | Status: DC
  Filled 2023-05-27 (×2): qty 1000

## 2023-05-27 MED ORDER — METHYLERGONOVINE MALEATE 0.2 MG PO TABS: 0.2000 mg | ORAL_TABLET | ORAL | Status: DC | PRN

## 2023-05-27 MED ORDER — ONDANSETRON HCL 4 MG/2ML IJ SOLN: 4.0000 mg | INTRAMUSCULAR | Status: DC | PRN

## 2023-05-27 MED ORDER — SODIUM CHLORIDE 0.9 % IV SOLN
2.0000 g | Freq: Four times a day (QID) | INTRAVENOUS | Status: DC
  Administered 2023-05-27: 2 g via INTRAVENOUS
  Filled 2023-05-27: qty 2000

## 2023-05-27 MED ORDER — DIPHENHYDRAMINE HCL 25 MG PO CAPS: 25.0000 mg | ORAL_CAPSULE | Freq: Four times a day (QID) | ORAL | Status: DC | PRN

## 2023-05-27 MED ORDER — METHYLERGONOVINE MALEATE 0.2 MG/ML IJ SOLN: 0.2000 mg | INTRAMUSCULAR | Status: DC | PRN

## 2023-05-27 MED ORDER — DIBUCAINE (PERIANAL) 1 % EX OINT: 1.0000 | TOPICAL_OINTMENT | CUTANEOUS | Status: DC | PRN

## 2023-05-27 MED ORDER — MEDROXYPROGESTERONE ACETATE 150 MG/ML IM SUSP: 150.0000 mg | INTRAMUSCULAR | Status: DC | PRN

## 2023-05-27 MED ORDER — FERROUS SULFATE 325 (65 FE) MG PO TABS
325.0000 mg | ORAL_TABLET | ORAL | Status: DC
  Administered 2023-05-27 – 2023-05-29 (×2): 325 mg via ORAL
  Filled 2023-05-27 (×2): qty 1

## 2023-05-27 MED ORDER — ACETAMINOPHEN 325 MG PO TABS: 650.0000 mg | ORAL_TABLET | ORAL | Status: DC | PRN

## 2023-05-27 MED ORDER — NALOXONE HCL 4 MG/0.1ML NA LIQD: 1.0000 | Freq: Once | NASAL | Status: DC | PRN

## 2023-05-27 MED ORDER — COCONUT OIL OIL: 1.0000 | TOPICAL_OIL | Status: DC | PRN

## 2023-05-27 MED ORDER — SIMETHICONE 80 MG PO CHEW: 80.0000 mg | CHEWABLE_TABLET | ORAL | Status: DC | PRN

## 2023-05-27 MED ORDER — GENTAMICIN SULFATE 40 MG/ML IJ SOLN
5.0000 mg/kg | INTRAVENOUS | Status: DC
  Administered 2023-05-27: 360 mg via INTRAVENOUS
  Filled 2023-05-27: qty 9

## 2023-05-27 MED ORDER — TRANEXAMIC ACID-NACL 1000-0.7 MG/100ML-% IV SOLN: 1000.0000 mg | INTRAVENOUS | Status: DC

## 2023-05-27 MED ORDER — SENNOSIDES-DOCUSATE SODIUM 8.6-50 MG PO TABS
2.0000 | ORAL_TABLET | ORAL | Status: DC
  Administered 2023-05-27 – 2023-05-29 (×2): 2 via ORAL
  Filled 2023-05-27 (×2): qty 2

## 2023-05-27 MED ORDER — WITCH HAZEL-GLYCERIN EX PADS: 1.0000 | MEDICATED_PAD | CUTANEOUS | Status: DC | PRN

## 2023-05-27 MED ORDER — BENZOCAINE-MENTHOL 20-0.5 % EX AERO: 1.0000 | INHALATION_SPRAY | CUTANEOUS | Status: DC | PRN

## 2023-05-27 NOTE — Lactation Note (Signed)
 This note was copied from a baby'Laura chart.  NICU Lactation Consultation Note  Patient Name: Laura Callahan [id number] Date: 05/27/2023 Age:25 hours  Reason for consult: Initial assessment; NICU baby; Early term 37-38.6wks; Other (Comment); Infant < 6lbs (LPNC, maternal Hx of polysubstance use (benzodiazepines, opiates, amphetamines, cocaine, THC))  SUBJECTIVE LC spoke with OBSC Laura Callahan and she reported that Laura Callahan has decided to do donor milk and formula only; she won't be pumping. Asked if she will need education on lactation suppression, but RN voiced she'll be providing that education prior maternal discharge. Lactation services are completed at this time.  OBJECTIVE Infant data: Mother'Laura Current Feeding Choice: -- (Donor milk and formula)  O2 Device: CPAP FiO2 (%): 21 %  Infant feeding assessment IDFTS - Readiness: 5 (CPAP +5)   Maternal data: [id number] Vaginal, Spontaneous  ASSESSMENT Infant: Feeding Status: (Laura) Scheduled 8-11-2-5 Feeding method: Tube/Gavage (Bolus)  Maternal: Not pumping  INTERVENTIONS/PLAN Interventions: No data recorded Plan: Consult Status: Complete   Laura Callahan Laura Callahan 05/27/2023, 11:44 AM

## 2023-05-27 NOTE — Progress Notes (Signed)
 Pt told me she was going to visit the baby in the NICU. I witnessed the patient get into a yellow taxi at the [employer]bout at [address] on [address] and ride off.

## 2023-05-27 NOTE — Progress Notes (Signed)
 Patient Vitals for the past 4 hrs:  BP Temp Temp src Pulse SpO2  05/27/23 0423 -- 99.4 F (37.4 C) Oral -- --  05/27/23 0308 -- (!) 100.9 F (38.3 C) -- -- --  05/27/23 0300 (!) 97/50 -- -- (!) 105 97 %  05/27/23 0232 111/67 -- -- 96 95 %  05/27/23 0201 (!) 106/43 -- -- (!) 118 --  05/27/23 0158 -- (!) 101.9 F (38.8 C) Axillary -- --  05/27/23 0131 105/60 -- -- 99 --  05/27/23 0105 -- (!) 101.5 F (38.6 C) Axillary -- --  05/27/23 0031 109/63 -- -- 81 --    Comfortable w/epidural.  Has received amp/gent/tylenol for Triple I.  FHR  160 w/deep repetitive variable decels, not responsive to position changes and IVF bolus or d/c'ing pitocin. Terbutaline given and amnioinfusion started.  Cx 9/100/0 station.  FHR now 165, moderate variability, + accels, no decels, Ctx nearly stopped.  Will allow things to resume on their own, presumably then push when complete and urge.  Dr. Jolayne Panther reviewed strip and aware.

## 2023-05-27 NOTE — Anesthesia Postprocedure Evaluation (Signed)
 Anesthesia Post Note  Patient: Laura Callahan  Procedure(s) Performed: AN AD HOC LABOR EPIDURAL     Patient location during evaluation: Mother Baby Anesthesia Type: Epidural Level of consciousness: awake and alert Pain management: pain level controlled Vital Signs Assessment: post-procedure vital signs reviewed and stable Respiratory status: spontaneous breathing, nonlabored ventilation and respiratory function stable Cardiovascular status: stable Postop Assessment: no headache, no backache, epidural receding, no apparent nausea or vomiting, patient able to bend at knees, able to ambulate and adequate PO intake Anesthetic complications: no   No notable events documented.  Last Vitals:  Vitals:   05/27/23 0834 05/27/23 0922  BP: 117/67 113/61  Pulse: 84 80  Resp: 16 16  Temp:  36.6 C  SpO2: 97% 96%    Last Pain:  Vitals:   05/27/23 1258  TempSrc:   PainSc: Asleep   Pain Goal: Patients Stated Pain Goal: 4 (05/27/23 0834)                 Laban Emperor

## 2023-05-27 NOTE — Discharge Summary (Signed)
 Postpartum Discharge Summary     Patient Name: Laura Callahan DOB: August 31, 2000 MRN: [id number]  Date of admission: 05/26/2023 Delivery date:05/27/2023 Delivering provider: Jacklyn Shell Date of discharge: 05/29/2023  Admitting diagnosis: Fetal growth restriction antepartum [O36.5990] Intrauterine pregnancy: [redacted]w[redacted]d     Secondary diagnosis:  Principal Problem:   Fetal growth restriction antepartum Active Problems:   Supervision of high risk pregnancy, antepartum   Cocaine use complicating pregnancy   Drug use complicating pregnancy   Opioid use disorder   History of preterm delivery, currently pregnant   Vaginal delivery  Additional problems: polysubstance abuse, Bipolar 1 disorder    Discharge diagnosis: Term Pregnancy Delivered                                              Post partum procedures: none Augmentation: AROM, Pitocin, Cytotec, and IP Foley Complications: None  Hospital course: Induction of Labor With Vaginal Delivery   22 y.o. yo [phone] at 100w2d was admitted to the hospital 05/26/2023 for induction of labor.  Indication for induction:  FGR .  Patient had an labor course complicated by nothing Membrane Rupture Time/Date: 2:35 AM,05/26/2023  Delivery Method:Vaginal, Spontaneous Operative Delivery:N/A Episiotomy: None Lacerations:  None Details of delivery can be found in separate delivery note.  Patient had a postpartum course complicated by:   #OUD Patient with minimal withdrawal symptoms during her stay. Elected to resume methadone, planning to go to [facility] the following day to finish enrollment and start getting dosed. Seen by SW with some barriers to discharge of infant, CPS and other processes in motion at time of discharge. Patient will follow up at [facility].   Patient is discharged home 05/29/23.  Newborn Data: Birth date:05/27/2023 Birth time:6:33 AM Gender:Female Living status:Living Apgars:8 ,7  Weight:2580 g  Magnesium Sulfate  received: No BMZ received: No Rhophylac:N/A MMR:N/A T-DaP: no Flu: no RSV Vaccine received: No Transfusion:No  Immunizations received: Immunization History  Administered Date(s) Administered   DTaP 11/04/1998, 01/30/1999, 09/04/1999, 07/26/2000   Dtap, Unspecified 08/22/2002   Fluzone Influenza virus vaccine,trivalent (IIV3), split virus 01/04/2012   HIB (PRP-OMP) 11/04/1998   HIB, Unspecified 01/30/1999, 05/08/1999, 09/04/1999, 12/21/1999   HPV Quadrivalent 06/23/2012, 01/11/2014, 04/15/2014   Hep B, Unspecified 04-22-98, 09/18/1998, 09/04/1999   Hepatitis A, Ped/Adol-2 Dose 10/03/2006, 10/07/2009   Hepatitis B, PED/ADOLESCENT 06/27/2012   IPV 11/04/1998, 01/30/1999, 12/21/1999   Influenza Nasal 01/29/2008   Influenza, Seasonal, Injecte, Preservative Fre 01/29/2008, 11/19/2010   Influenza,inj,Quad PF,6+ Mos 01/11/2014, 03/04/2015   MMR 12/21/1999, 08/22/2002   Meningococcal Conjugate 11/19/2010, 10/16/2014   Pneumococcal Conjugate PCV 7 01/30/1999, 09/04/1999, 12/21/1999   Polio, Unspecified 08/22/2002   Tdap 10/07/2009   Varicella 07/26/2000, 10/03/2006    Physical exam  Vitals:   05/28/23 1950 05/29/23 0034 05/29/23 0353 05/29/23 0735  BP: 121/67 117/63 106/61 118/67  Pulse: 97 77 98 93  Resp: 17 18 16 14   Temp: 98.5 F (36.9 C) 98.3 F (36.8 C) 98 F (36.7 C) 98.1 F (36.7 C)  TempSrc: Oral Oral Oral Oral  SpO2: 98% 100% 99% 97%  Weight:      Height:       General: alert, cooperative, and no distress Lochia: appropriate Uterine Fundus: firm Incision: N/A DVT Evaluation: No evidence of DVT seen on physical exam. No significant calf/ankle edema. Labs: Lab Results  Component Value Date   WBC 10.1 05/26/2023  HGB 11.0 (L) 05/26/2023   HCT 33.4 (L) 05/26/2023   MCV 93.6 05/26/2023   PLT 247 05/26/2023      Latest Ref Rng & Units 03/25/2023    7:46 PM  CMP  Glucose 70 - 99 mg/dL 742   BUN 6 - 20 mg/dL 5   Creatinine 5.95 - 6.38 mg/dL 7.56    Sodium 433 - 295 mmol/L 139   Potassium 3.5 - 5.1 mmol/L 3.3   Chloride 98 - 111 mmol/L 103   CO2 22 - 32 mmol/L 28   Calcium 8.9 - 10.3 mg/dL 8.8   Total Protein 6.5 - 8.1 g/dL 5.6   Total Bilirubin 0.0 - 1.2 mg/dL 0.4   Alkaline Phos 38 - 126 U/L 60   AST 15 - 41 U/L 18   ALT 0 - 44 U/L 11    Edinburgh Score:    05/28/2023    1:00 PM  Edinburgh Postnatal Depression Scale Screening Tool  I have been able to laugh and see the funny side of things. 0  I have looked forward with enjoyment to things. 0  I have blamed myself unnecessarily when things went wrong. 1  I have been anxious or worried for no good reason. 0  I have felt scared or panicky for no good reason. 0  Things have been getting on top of me. 0  I have been so unhappy that I have had difficulty sleeping. 0  I have felt sad or miserable. 0  I have been so unhappy that I have been crying. 0  The thought of harming myself has occurred to me. 0  Edinburgh Postnatal Depression Scale Total 1   Edinburgh Postnatal Depression Scale Total: 1   After visit meds:  Allergies as of 05/29/2023       Reactions   Flax Seed Oil [flax Seed Oil] Swelling   blisters   Codeine Nausea And Vomiting, Other (See Comments)   Blisters as a child- tolerates percocet and other pain medications without issue        Medication List     STOP taking these medications    dicyclomine 10 MG capsule Commonly known as: BENTYL   promethazine 25 MG tablet Commonly known as: PHENERGAN   traZODone 50 MG tablet Commonly known as: DESYREL       TAKE these medications    acetaminophen 325 MG tablet Commonly known as: Tylenol Take 2 tablets (650 mg total) by mouth every 4 (four) hours as needed (for pain scale < 4).   ferrous sulfate 325 (65 FE) MG EC tablet Take 1 tablet (325 mg total) by mouth every other day.   ibuprofen 600 MG tablet Commonly known as: ADVIL Take 1 tablet (600 mg total) by mouth every 6 (six) hours.    naloxone 4 MG/0.1ML Liqd nasal spray kit Commonly known as: NARCAN Use as needed to reverse opioid overdose   omeprazole 20 MG tablet Commonly known as: PriLOSEC OTC Take 1 tablet (20 mg total) by mouth in the morning and at bedtime.   polyethylene glycol 17 g packet Commonly known as: MIRALAX / GLYCOLAX Take 17 g by mouth 2 (two) times daily.   prenatal multivitamin Tabs tablet Take 1 tablet by mouth daily at 12 noon.         Discharge home in stable condition Infant Feeding: Bottle and Breast Infant Disposition:NICU Discharge instruction: per After Visit Summary and Postpartum booklet. Activity: Advance as tolerated. Pelvic rest for 6 weeks.  Diet:  routine diet Future Appointments:No future appointments. Follow up Visit:   Please schedule this patient for a In person postpartum visit in 4 weeks with the following provider:  Adalin Vanderploeg or [place] . Additional Postpartum F/U:  High risk pregnancy complicated by: FGR, opiod use disorder Delivery mode:  Vaginal, Spontaneous Anticipated Birth Control:  Unsure>leaning towards OCPs vs IUD, discuss at postpartum visit   05/29/2023 Venora Maples, MD

## 2023-05-27 NOTE — Social Work (Addendum)
 CSW attempted to meet with MOB to complete an assessment and informed by care team the MOB was not in her room. RN reported that MOB had left the unit and was unsure of her whereabouts. CSW checked infant's room on NICU (3S19) and MOB was not present.   Social work will continue to make the effort to meet with MOB.   Vivi Barrack, MSW, LCSW Clinical Social Worker  [phone] 05/27/2023  3:37 PM

## 2023-05-28 MED ORDER — METHADONE HCL 10 MG PO TABS
10.0000 mg | ORAL_TABLET | ORAL | Status: DC | PRN
  Administered 2023-05-28 – 2023-05-29 (×2): 10 mg via ORAL
  Filled 2023-05-28 (×2): qty 1

## 2023-05-28 MED ORDER — METHADONE HCL 10 MG/ML PO CONC: 10.0000 mg | ORAL | Status: DC | PRN

## 2023-05-28 MED ORDER — LIDOCAINE HCL 1 % IJ SOLN
0.0000 mL | Freq: Once | INTRAMUSCULAR | Status: DC | PRN
  Filled 2023-05-28: qty 20

## 2023-05-28 MED ORDER — LIDOCAINE HCL 1 % IJ SOLN: 0.0000 mL | Freq: Once | INTRAMUSCULAR | Status: DC | PRN

## 2023-05-28 MED ORDER — ETONOGESTREL 68 MG ~~LOC~~ IMPL
68.0000 mg | DRUG_IMPLANT | Freq: Once | SUBCUTANEOUS | Status: DC
  Filled 2023-05-28: qty 1

## 2023-05-28 MED ORDER — ETONOGESTREL 68 MG ~~LOC~~ IMPL: 68.0000 mg | DRUG_IMPLANT | Freq: Once | SUBCUTANEOUS | Status: DC

## 2023-05-28 MED ORDER — POLYETHYLENE GLYCOL 3350 17 G PO PACK
17.0000 g | PACK | Freq: Two times a day (BID) | ORAL | Status: DC
  Administered 2023-05-28 – 2023-05-29 (×2): 17 g via ORAL
  Filled 2023-05-28 (×2): qty 1

## 2023-05-28 MED ORDER — METHADONE HCL 10 MG/ML IJ SOLN: 10.0000 mg | INTRAMUSCULAR | Status: DC | PRN

## 2023-05-28 NOTE — Progress Notes (Signed)
 POSTPARTUM PROGRESS NOTE  Post Partum Day #1  Subjective:  Laura Callahan is a 23 y.o. [phone] s/p NSVD at [redacted]w[redacted]d.  She reports she is doing well. No acute events overnight. She denies any problems with ambulating, voiding or po intake. Denies nausea or vomiting.  Pain is well controlled.  Lochia is minimal.  Objective: Blood pressure (!) 100/59, pulse 72, temperature (!) 97.5 F (36.4 C), temperature source Oral, resp. rate 18, height 5\' 5"  (1.651 m), weight 71.3 kg, SpO2 98%, unknown if currently breastfeeding.  Physical Exam:  General: alert, cooperative and no distress Chest: no respiratory distress Heart:regular rate, distal pulses intact Abdomen: soft, nontender,  Uterine Fundus: firm, appropriately tender DVT Evaluation: No calf swelling or tenderness Extremities: no LE edema Skin: warm, dry  Recent Labs    05/26/23 0219  HGB 11.0*  HCT 33.4*    Assessment/Plan: ERVA KOKE is a 23 y.o. [id number] s/p NSVD at [redacted]w[redacted]d   PPD#1 - Doing well  Routine postpartum care  OUD: currently without withdrawal symptoms. Will start methadone 10 mg q1h PRN to max dose of 40 mg in first day, tomorrow morning start total daily dose as single dose in AM. Reports she has already done an intake visit with [facility] and will plan to follow up with them outpatient for continued dosing.    Contraception: desires Nexplanon Feeding: breast if possible Dispo: Plan for discharge PPD#2.   LOS: 2 days    Venora Maples, MD/MPH Attending Family Medicine Physician, [facility] for [facility], [facility]  05/28/2023, 10:57 AM

## 2023-05-28 NOTE — Plan of Care (Signed)

## 2023-05-28 NOTE — Clinical Social Work Maternal (Signed)
 CLINICAL SOCIAL WORK MATERNAL/CHILD NOTE  Patient Details  Name: Laura Callahan MRN: [id number] Date of Birth: [birth year]  Date:  05/28/2023  Clinical Social Worker Initiating Note:  Albertine Patricia, LCSWA Date/Time: Initiated:  05/28/23/1357     Child's Name:  Laura Callahan, DOB: 05/27/2025   Biological Parents:  Mother, Father (FOB: Laura Callahan, DOB: 12/04/1985)   Need for Interpreter:  None   Reason for Referral:  Current Substance Use/Substance Use During Pregnancy  , Behavioral Health Concerns, Other (Comment) (NICU admission)   Address:  [address] [place] [address]    Phone number:  [phone] (home)     Additional phone number:   Household Members/Support Persons (HM/SP):   Household Member/Support Person 1   HM/SP Name Relationship DOB or Age  HM/SP -1 Laura Callahan FOB 12/04/1985  HM/SP -2        HM/SP -3        HM/SP -4        HM/SP -5        HM/SP -6        HM/SP -7        HM/SP -8          Natural Supports (not living in the home):  Immediate Family, Extended Family   Professional Supports: Other (Comment) [facility] [employer] Center)   Employment: Unemployed   Type of Work:     Education:  Some Materials engineer arranged:    Architect:  Media planner , Medicaid   Other Resources:      Cultural/Religious Considerations Which May Impact Care:    Strengths:  Ability to meet basic needs  , Home prepared for child  , Pediatrician chosen   Psychotropic Medications:         Pediatrician:    Armed forces operational officer area  Pediatrician List:   [facility]  [place]    [place]    [facility]      Pediatrician Fax Number:    Risk Factors/Current Problems:  Substance Use  , Mental Health Concerns  , Compliance with Treatment     Cognitive State:  Able to Concentrate  , Linear Thinking  , Goal Oriented  , Alert     Mood/Affect:  Calm  ,  Relaxed  , Interested  , Euthymic  , Comfortable     CSW Assessment: CSW was consulted due to substance exposed newborn, history of Bipolar Disorder I, depression, anxiety, and NICU admission. CSW met with MOB at infant's bedside to complete assessment. When CSW entered room, MOB was initially observed laying reclined in chair with eyes closed holding infant. MOB's RN accompanied CSW into the room and MOB awoke upon CSW and RN entering. RN encouraged MOB to notify RN if she becomes sleepy for assistance placing infant in isolette, MOB expressed understanding. CSW introduced self and explained reason for consult. MOB presented as oriented x4, with a euthymic mood and affect. MOB was agreeable to consult and remained engaged throughout encounter.   CSW inquired about MOB's living situation. MOB reports prior to giving birth, she was living with her mother Laura Callahan [phone]) and sister Laura Callahan, : 10/20/1996) at address listed in chart - [address] [place], [place] [address]. MOB reports at discharge, she and infant will be living with FOB, Laura Callahan at [address] [place], [place] [address].   CSW  inquired about MOB's other children. MOB reports she has 2 other children:  Laura Callahan (M), DOB: 08/27/2016 who lives with MOB's maternal grandmother, [name] [phone]) at [address] [place], [place] [address]. MOB reports [name] has custody of Laura Callahan and reports [facility] CPS involvement in custody determination.  Laura Callahan (M), DOB: 06/20/2018. MOB reports Laura Callahan was adopted and MOB's parental rights were terminated. MOB reports [facility] CPS involvement.   CSW inquired how MOB is feeling emotionally since infant's arrival. MOB reports she is feeling "really good." MOB shared she was feeling worried about infant's weight prior to his birth due to his small gestational size, sharing he weighed 4 pounds at her last ultrasound. MOB was  forthcoming about her substance use during pregnancy, and expressed concern that she was going to go into labor early due to her drug use during pregnancy. MOB reported feelings of relief that infant is now off of oxygen and has not yet shown signs of withdrawal.  CSW inquired about MOB's mental health history. MOB acknowledged diagnoses of Bipolar Disorder I, depression, and anxiety, stating she was diagnosed with all three diagnoses in 2016. CSW inquired about recent mental health symptoms. MOB denies endorsing recent symptoms of mania or depression, stating she could not recall the last time she felt manic, noting "it's been awhile." MOB reports endorsing symptoms of anxiety during pregnancy, stating "I have pretty bad anxiety." MOB reported symptoms marked by racing thoughts, restlessness and intrusive thoughts. MOB reported feeling concerned about infant's wellbeing throughout the course of her pregnancy due to her drug use, stating she was afraid that she would go into labor early if she started to withdrawal. CSW inquired about current mental health treatment. MOB reports she is not prescribed mental health medications or followed by a therapist. MOB reports that she has completed intake with [facility] where she plans to follow up at on Monday, 05/30/23 for Methadone Medicated Assisted Treatment and states she will also receive mental health treatment there. MOB declined additional outpatient mental health resources, stating she prefers to receive both substance use treatment and mental health treatment at the same facility and feels good about her care plan with [facility]. MOB reports she has mental health resources at home from prior hospital visits.  CSW inquired about a history of postpartum depression. MOB reported endorsing symptoms of postpartum depression after her second child, sharing she "had a lot going on" at the time, marked by open court cases and attributed her symptoms  to losing custody of her son. CSW assessed for current supports. MOB identified FOB, her mother, aunt, and grandmother as supports. CSW assessed for safety. MOB denied current SI/HI. MOB did not display any acute mental health symptoms during consult.  CSW provided education regarding the baby blues period vs. perinatal mood disorders, discussed treatment and gave resources for mental health follow up if concerns arise.  CSW recommends self-evaluation during the postpartum time period using the New Mom Checklist from Postpartum Progress and encouraged MOB to contact a medical professional if symptoms are noted at any time.    CSW informed MOB about hospital drug screen policy due to MOB testing positive for illicit substances and reported substance use during pregnancy . Per chart review, MOB tested positive for opiates, cocaine, benzodiazepines, and amphetamines 05/03/2023. Of note, MOB had several positive drug tests throughout her pregnancy. CSW explained a CPS report would be made due to infant's UDS resulting positive for cocaine and infant's CDS would continue  be monitored. MOB expressed understanding and reported surprise that infant did not test positive for opiates. CSW inquired about substance use during pregnancy. MOB reported using fentanyl daily throughout her pregnancy and acknowledged that she presented to the MAU several times with unsuccessful attempts to detox/start Medication Assisted Treatment (MAT) via Subutex and Suboxone. MOB explained that each time she began MAT, she would go into precipitated withdrawal which resulted in her return to use. MOB reported at the end of her pregnancy she snorted "less than 1/2 gram per day" of fentanyl "to not be sick" due to withdrawal. MOB expressed fear that she would go into preterm labor if she began withdrawing. CSW inquired about other substance use. MOB denied using other substances during pregnancy, stating that if she tested positive for other  substances, it was due to fentanyl being laced. CSW inquired about MOB's interest in additional substance use treatment resources. MOB reported that she will be started on methadone MAT while inpatient and reiterated that she feels good about following up with [facility] at discharge.  MOB reports she has all needed items for infant, including a car seat and bassinet. MOB has chosen [facility] for infant's follow up care.  FOB entered room towards end of assessment. MOB provided verbal consent for CSW to continue with consult. Domestic violence was not assessed due to FOB being present.  CSW and MOB discussed infant's NICU admission. CSW informed MOB about the NICU, what to expect, and resources/supports available while infant is admitted to the NICU. MOB reported that she feels updated about infant's care. MOB denied any transportation barriers with visiting infant in the NICU.  CSW provided review of Sudden Infant Death Syndrome (SIDS) precautions.   CSW inquired about WIC/Food Stamp benefits. MOB reported that she was in the process of applying for food stamps and recently emailed her caseworker her proof of pregnancy. MOB reports she plans to follow up regarding food stamp benefits on Monday, 05/30/23. MOB was unable to recall if she has applied for [facility] and expressed interest in a referral, which CSW placed. CSW inquired about additional resource needs, MOB denied additional needs at this time.  CSW placed call to [facility] Department of Social Services CPS line and made CPS report to social worker Michael Litter due to infant's UDS resulting positive for cocaine and maternal substance use during pregnancy.   CSW will continue to offer support and resources to family while infant remains in NICU. CSW awaiting determination of CPS report.  CSW Plan/Description:  Sudden Infant Death Syndrome (SIDS) Education, Perinatal Mood  and Anxiety Disorder (PMADs) Education, Other Information/Referral to [facility], Child Therapist, nutritional Report  , CSW Awaiting CPS Disposition Plan, CSW Will Continue to Monitor Umbilical Cord Tissue Drug Screen Results and Make Report if Reggie Pile, LCSWA 05/28/2023, 2:49 PM

## 2023-05-29 MED ORDER — OMEPRAZOLE MAGNESIUM 20 MG PO TBEC
20.0000 mg | DELAYED_RELEASE_TABLET | Freq: Two times a day (BID) | ORAL | 3 refills | Status: AC
Start: 2023-05-29 — End: ?

## 2023-05-29 MED ORDER — POLYETHYLENE GLYCOL 3350 17 G PO PACK: 17.0000 g | PACK | Freq: Two times a day (BID) | ORAL | 0 refills | Status: AC

## 2023-05-29 MED ORDER — ACETAMINOPHEN 325 MG PO TABS: 650.0000 mg | ORAL_TABLET | ORAL | 0 refills | Status: AC | PRN

## 2023-05-29 MED ORDER — FERROUS SULFATE 325 (65 FE) MG PO TBEC: 325.0000 mg | DELAYED_RELEASE_TABLET | ORAL | 2 refills | Status: AC

## 2023-05-29 MED ORDER — IBUPROFEN 600 MG PO TABS: 600.0000 mg | ORAL_TABLET | Freq: Four times a day (QID) | ORAL | 0 refills | Status: AC

## 2023-05-29 MED ORDER — NALOXONE HCL 4 MG/0.1ML NA LIQD: NASAL | 11 refills | Status: DC

## 2023-05-29 NOTE — Progress Notes (Signed)
 Patient stated no pain at discharge, but requested PRN Methadone prior to leaving. Dr. Crissie Reese notified at 1102. MD stated may give the PRN dose and discharge patient with no need to hold patient for reevaluation.

## 2023-05-29 NOTE — Progress Notes (Signed)
 This nurse did call NICU nurse that is caring for this patient's infant to verify that patient has been in the NICU for duration that the patient has been off of the unit. NICU nurse confirmed that the patient had been in NICU with infant and was actually on the way down to her room.

## 2023-05-29 NOTE — Progress Notes (Signed)
 Discharge teaching provided to patient regarding postpartum and opiate symptoms to report. Patient re-educated on proper use of Narcan and the importance of keeping the kit on hand. Patient advised of future appointment needs for follow-up, s/s of infection and reasons to return. Patient stated having no further questions an was discharged in good condition.

## 2023-05-29 NOTE — Progress Notes (Signed)
 Patient arrived back to unit, declined the need to take her scheduled ibuprofen. Will continue to monitor patient.

## 2023-05-29 NOTE — Discharge Instructions (Signed)
 We discussed at discharge that you will follow up at [facility] treatment center for methadone.  If you have fever, severe abdominal pain, or heavy vaginal bleeding, return to the hospital to be evaluated.   Do not have intercourse until we see you at your postpartum visit and finalize your birth control plan. Information on IUD's is attached.

## 2023-05-29 NOTE — Plan of Care (Signed)
 Problem: Education: Goal: Knowledge of General Education information will improve Description: Including pain rating scale, medication(s)/side effects and non-pharmacologic comfort measures 05/29/2023 0943 by Samuella Cota, RN Outcome: Completed/Met 05/29/2023 0836 by Samuella Cota, RN Outcome: Progressing   Problem: Health Behavior/Discharge Planning: Goal: Ability to manage health-related needs will improve 05/29/2023 0943 by Samuella Cota, RN Outcome: Completed/Met 05/29/2023 0836 by Samuella Cota, RN Outcome: Progressing   Problem: Clinical Measurements: Goal: Ability to maintain clinical measurements within normal limits will improve 05/29/2023 0943 by Samuella Cota, RN Outcome: Completed/Met 05/29/2023 0836 by Samuella Cota, RN Outcome: Progressing Goal: Will remain free from infection 05/29/2023 0943 by Samuella Cota, RN Outcome: Completed/Met 05/29/2023 0836 by Samuella Cota, RN Outcome: Progressing Goal: Diagnostic test results will improve 05/29/2023 0943 by Samuella Cota, RN Outcome: Completed/Met 05/29/2023 0836 by Samuella Cota, RN Outcome: Progressing Goal: Respiratory complications will improve 05/29/2023 0943 by Samuella Cota, RN Outcome: Completed/Met 05/29/2023 0836 by Samuella Cota, RN Outcome: Progressing Goal: Cardiovascular complication will be avoided 05/29/2023 1610 by Samuella Cota, RN Outcome: Completed/Met 05/29/2023 0836 by Samuella Cota, RN Outcome: Progressing   Problem: Activity: Goal: Risk for activity intolerance will decrease 05/29/2023 0943 by Samuella Cota, RN Outcome: Completed/Met 05/29/2023 0836 by Samuella Cota, RN Outcome: Progressing   Problem: Nutrition: Goal: Adequate nutrition will be maintained 05/29/2023 0943 by Samuella Cota, RN Outcome: Completed/Met 05/29/2023 0836 by Samuella Cota, RN Outcome: Progressing   Problem: Coping: Goal: Level of anxiety will decrease 05/29/2023 0943 by Samuella Cota, RN Outcome:  Completed/Met 05/29/2023 0836 by Samuella Cota, RN Outcome: Progressing   Problem: Elimination: Goal: Will not experience complications related to bowel motility 05/29/2023 0943 by Samuella Cota, RN Outcome: Completed/Met 05/29/2023 0836 by Samuella Cota, RN Outcome: Progressing Goal: Will not experience complications related to urinary retention 05/29/2023 0943 by Samuella Cota, RN Outcome: Completed/Met 05/29/2023 0836 by Samuella Cota, RN Outcome: Progressing   Problem: Pain Managment: Goal: General experience of comfort will improve and/or be controlled 05/29/2023 0943 by Samuella Cota, RN Outcome: Completed/Met 05/29/2023 0836 by Samuella Cota, RN Outcome: Progressing   Problem: Safety: Goal: Ability to remain free from injury will improve 05/29/2023 0943 by Samuella Cota, RN Outcome: Completed/Met 05/29/2023 0836 by Samuella Cota, RN Outcome: Progressing   Problem: Skin Integrity: Goal: Risk for impaired skin integrity will decrease 05/29/2023 0943 by Samuella Cota, RN Outcome: Completed/Met 05/29/2023 0836 by Samuella Cota, RN Outcome: Progressing   Problem: Education: Goal: Knowledge of condition will improve 05/29/2023 0943 by Samuella Cota, RN Outcome: Completed/Met 05/29/2023 0836 by Samuella Cota, RN Outcome: Progressing Goal: Individualized Educational Video(s) 05/29/2023 0943 by Samuella Cota, RN Outcome: Completed/Met 05/29/2023 0836 by Samuella Cota, RN Outcome: Progressing Goal: Individualized Newborn Educational Video(s) 05/29/[phone] by Samuella Cota, RN Outcome: Completed/Met 05/29/2023 0836 by Samuella Cota, RN Outcome: Progressing   Problem: Activity: Goal: Will verbalize the importance of balancing activity with adequate rest periods 05/29/2023 0943 by Samuella Cota, RN Outcome: Completed/Met 05/29/2023 0836 by Samuella Cota, RN Outcome: Progressing Goal: Ability to tolerate increased activity will improve 05/29/2023 0943 by Samuella Cota,  RN Outcome: Completed/Met 05/29/2023 0836 by Samuella Cota, RN Outcome: Progressing   Problem: Coping: Goal: Ability to identify and utilize available resources and services will improve 05/29/2023 0943 by Samuella Cota, RN Outcome: Completed/Met 05/29/2023 [phone]  by Samuella Cota, RN Outcome: Progressing   Problem: Life Cycle: Goal: Chance of risk for complications during the postpartum period will decrease 05/29/2023 0943 by Samuella Cota, RN Outcome: Completed/Met 05/29/2023 0836 by Samuella Cota, RN Outcome: Progressing   Problem: Role Relationship: Goal: Ability to demonstrate positive interaction with newborn will improve 05/29/2023 0943 by Samuella Cota, RN Outcome: Completed/Met 05/29/2023 0836 by Samuella Cota, RN Outcome: Progressing   Problem: Skin Integrity: Goal: Demonstration of wound healing without infection will improve 05/29/2023 0943 by Samuella Cota, RN Outcome: Completed/Met 05/29/2023 0836 by Samuella Cota, RN Outcome: Progressing

## 2023-05-29 NOTE — Plan of Care (Signed)

## 2023-05-30 LAB — SURGICAL PATHOLOGY

## 2023-06-08 ENCOUNTER — Telehealth (HOSPITAL_COMMUNITY): Payer: Self-pay

## 2023-06-08 NOTE — Telephone Encounter (Signed)
 06/08/2023 1131  Name: Laura Callahan MRN: [id number] DOB: 30-Jul-2000  Reason for Call:  Transition of Care Hospital Discharge Call  Contact Status: Patient Contact Status: Complete  Language assistant needed:          Follow-Up Questions: Do You Have Any Concerns About Your Health As You Heal From Delivery?: No Do You Have Any Concerns About Your Infants Health?: Infant in NICU  Edinburgh Postnatal Depression Scale:  In the Past 7 Days: I have been able to laugh and see the funny side of things.: As much as I always could I have looked forward with enjoyment to things.: As much as I ever did I have blamed myself unnecessarily when things went wrong.: Not very often I have been anxious or worried for no good reason.: Yes, sometimes I have felt scared or panicky for no good reason.: Yes, sometimes Things have been getting on top of me.: No, most of the time I have coped quite well I have been so unhappy that I have had difficulty sleeping.: Not at all I have felt sad or miserable.: No, not at all I have been so unhappy that I have been crying.: No, never The thought of harming myself has occurred to me.: Never Edinburgh Postnatal Depression Scale Total: 6  PHQ2-9 Depression Scale:     Discharge Follow-up: Edinburgh score requires follow up?: No Patient was advised of the following resources:: Breastfeeding Support Group, Support Group  Post-discharge interventions: NA  Signature  Signe Colt

## 2023-07-01 ENCOUNTER — Ambulatory Visit: Admitting: Family Medicine

## 2023-09-09 ENCOUNTER — Other Ambulatory Visit: Payer: Self-pay

## 2023-09-09 ENCOUNTER — Encounter (HOSPITAL_BASED_OUTPATIENT_CLINIC_OR_DEPARTMENT_OTHER): Payer: Self-pay

## 2023-09-09 DIAGNOSIS — Z5321 Procedure and treatment not carried out due to patient leaving prior to being seen by health care provider: Secondary | ICD-10-CM | POA: Diagnosis not present

## 2023-09-09 DIAGNOSIS — R109 Unspecified abdominal pain: Secondary | ICD-10-CM | POA: Diagnosis present

## 2023-09-09 DIAGNOSIS — N939 Abnormal uterine and vaginal bleeding, unspecified: Secondary | ICD-10-CM | POA: Diagnosis not present

## 2023-09-09 NOTE — ED Triage Notes (Signed)
 Pt states she had a IUD placed about 6 weeks ago states she is having heavy bleeding, abdominal cramping & it is not time for her period. Denies any other s/s at time of triage.

## 2023-09-10 ENCOUNTER — Emergency Department (HOSPITAL_BASED_OUTPATIENT_CLINIC_OR_DEPARTMENT_OTHER)
Admission: EM | Admit: 2023-09-10 | Discharge: 2023-09-10 | Disposition: A | Discharge status: Home / Self Care | Attending: Emergency Medicine | Admitting: Emergency Medicine

## 2023-09-10 ENCOUNTER — Telehealth: Admitting: Nurse Practitioner

## 2023-09-10 NOTE — ED Notes (Signed)
 Second call for room assignment no answer

## 2023-10-18 ENCOUNTER — Encounter: Payer: Self-pay | Admitting: Family Medicine

## 2023-10-18 ENCOUNTER — Ambulatory Visit: Admitting: Family Medicine

## 2023-10-18 ENCOUNTER — Other Ambulatory Visit (HOSPITAL_COMMUNITY)
Admission: RE | Admit: 2023-10-18 | Discharge: 2023-10-18 | Disposition: A | Discharge status: Home / Self Care | Source: Ambulatory Visit | Attending: Family Medicine | Admitting: Family Medicine

## 2023-10-18 VITALS — BP 116/79 | HR 109 | Wt 130.9 lb

## 2023-10-18 DIAGNOSIS — Z124 Encounter for screening for malignant neoplasm of cervix: Secondary | ICD-10-CM | POA: Insufficient documentation

## 2023-10-18 DIAGNOSIS — Z3202 Encounter for pregnancy test, result negative: Secondary | ICD-10-CM

## 2023-10-18 DIAGNOSIS — L700 Acne vulgaris: Secondary | ICD-10-CM | POA: Diagnosis not present

## 2023-10-18 DIAGNOSIS — Z113 Encounter for screening for infections with a predominantly sexual mode of transmission: Secondary | ICD-10-CM | POA: Diagnosis not present

## 2023-10-18 DIAGNOSIS — Z23 Encounter for immunization: Secondary | ICD-10-CM | POA: Diagnosis not present

## 2023-10-18 DIAGNOSIS — Z30017 Encounter for initial prescription of implantable subdermal contraceptive: Secondary | ICD-10-CM | POA: Diagnosis not present

## 2023-10-18 DIAGNOSIS — F1190 Opioid use, unspecified, uncomplicated: Secondary | ICD-10-CM | POA: Diagnosis not present

## 2023-10-18 LAB — POCT PREGNANCY, URINE: Preg Test, Ur: NEGATIVE

## 2023-10-18 MED ORDER — ETONOGESTREL 68 MG ~~LOC~~ IMPL
68.0000 mg | DRUG_IMPLANT | Freq: Once | SUBCUTANEOUS | Status: AC
  Administered 2023-10-18: 68 mg via SUBCUTANEOUS

## 2023-10-18 MED ORDER — BUPRENORPHINE HCL 8 MG SL SUBL: 4.0000 mg | SUBLINGUAL_TABLET | Freq: Two times a day (BID) | SUBLINGUAL | 0 refills | Status: AC

## 2023-10-18 MED ORDER — DOXYCYCLINE HYCLATE 100 MG PO CAPS: 100.0000 mg | ORAL_CAPSULE | Freq: Every day | ORAL | 0 refills | Status: AC

## 2023-10-18 MED ORDER — NALOXONE HCL 4 MG/0.1ML NA LIQD: NASAL | 11 refills | Status: AC

## 2023-10-18 NOTE — Progress Notes (Signed)
 Laura Callahan is a 23 y.o. [phone] here today for MAT counseling and contraception.  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   Health Maintenance:   Last pap: No Cervical Cancer Screening results to display.  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  Physical Exam:  BP 116/79   Pulse (!) 109   Wt 130 lb 14.4 oz (59.4 kg)   BMI 21.78 kg/m  CONSTITUTIONAL: Well-developed, well-nourished female in no acute distress.  HEENT:  Normocephalic, atraumatic. External right and left ear normal. No scleral icterus.  NECK: Normal range of motion, supple, no  masses noted on observation SKIN: No rash noted. Not diaphoretic. No erythema. No pallor. MUSCULOSKELETAL: Normal range of motion. No edema noted. NEUROLOGIC: Alert and oriented to person, place, and time. Normal muscle tone coordination.  PSYCHIATRIC: Normal mood and affect. Normal behavior. Normal judgment and thought content. RESPIRATORY: Effort normal, no problems with respiration noted ABDOMEN: No masses noted. No other overt distention noted.   PELVIC: Normal appearing external genitalia; normal appearing vaginal mucosa and cervix.  No abnormal discharge noted.  Normal uterine size, no other palpable masses, no uterine or adnexal tenderness.  Labs and Imaging I have reviewed the PDMP during this encounter.  Last UDS: Lab Results  Component Value Date   CREATIUR 51 04/07/2023    Results for orders placed or performed in visit on 10/18/23 (from the past week)  Pregnancy, urine POC   Collection Time: 10/18/23  4:14 PM  Result Value Ref Range   Preg Test, Ur NEGATIVE NEGATIVE   No results found.      Assessment and Plan:   Problem List Items Addressed This Visit       Other   Opioid use disorder   Relevant Medications   naloxone  (NARCAN ) nasal spray 4 mg/0.1 mL   buprenorphine  (SUBUTEX ) 8 MG SUBL SL tablet   Other Relevant Orders   ToxAssure Flex 15, Ur   Other Visit Diagnoses       Screening examination for sexually transmitted disease    -  Primary   Relevant Orders  Cytology - PAP   HIV antibody (with reflex)   RPR   Hepatitis B Surface AntiGEN   Hepatitis C Antibody     Screening for cervical cancer       Relevant Orders   Cytology - PAP     Acne vulgaris       Relevant Medications   doxycycline  (VIBRAMYCIN ) 100 MG capsule     Encounter for initial prescription of implantable subdermal contraceptive       Relevant Medications   etonogestrel  (NEXPLANON ) implant 68 mg (Completed)   Other Relevant Orders   Pregnancy, urine POC (Completed)     Need for  Tdap vaccination       Relevant Orders   Tdap vaccine greater than or equal to 7yo IM (Completed)       Return in about 2 weeks (around 11/01/2023) for REACH clinic.   Future Appointments  Date Time Provider Department Center  11/01/2023  2:55 PM Claudene, [place] , CNM [facility]    Barabara Maier, DO [facility] Fellow, Faculty practice [facility], Center for [employer]

## 2023-10-18 NOTE — Progress Notes (Signed)
     GYNECOLOGY OFFICE PROCEDURE NOTE  Laura Callahan is a 23 y.o. [phone] here for Nexplanon  insertion.    Last pap smear: No Cervical Cancer Screening results to display.   Nexplanon  insertion Procedure Patient identified, informed consent performed, consent signed.   Patient does understand that irregular bleeding is a very common side effect of this medication. She was advised to have backup contraception for at least one week after placement. Pregnancy test in clinic today was negative .  Appropriate time out taken.  Patient's right arm was prepped and draped in the usual sterile fashion. Patient was prepped with alcohol swab and then injected with 3 ml of 1% lidocaine .  She was prepped with betadine, Nexplanon  removed from packaging,  Device confirmed in needle, then inserted full length of needle and withdrawn per handbook instructions. Nexplanon  was able to palpated in the patient's arm; patient palpated the insert herself. There was minimal blood loss.  Patient insertion site covered with guaze and a pressure bandage to reduce any bruising.  The patient tolerated the procedure well and was given post procedure instructions.   [place], DO 10/18/2023

## 2023-10-19 ENCOUNTER — Ambulatory Visit: Payer: Self-pay | Admitting: Family Medicine

## 2023-10-19 DIAGNOSIS — A749 Chlamydial infection, unspecified: Secondary | ICD-10-CM

## 2023-10-19 LAB — HEPATITIS B SURFACE ANTIGEN: Hepatitis B Surface Ag: NEGATIVE

## 2023-10-19 LAB — HEPATITIS C ANTIBODY: Hep C Virus Ab: NONREACTIVE

## 2023-10-19 LAB — RPR: RPR Ser Ql: NONREACTIVE

## 2023-10-19 LAB — HIV ANTIBODY (ROUTINE TESTING W REFLEX): HIV Screen 4th Generation wRfx: NONREACTIVE

## 2023-10-21 LAB — TOXASSURE FLEX 15, UR
6-ACETYLMORPHINE IA: NEGATIVE ng/mL
7-aminoclonazepam: NOT DETECTED ng/mg{creat}
AMPHETAMINES IA: NEGATIVE ng/mL
Alpha-hydroxyalprazolam: NOT DETECTED ng/mg{creat}
Alpha-hydroxymidazolam: NOT DETECTED ng/mg{creat}
Alpha-hydroxytriazolam: NOT DETECTED ng/mg{creat}
Alprazolam: NOT DETECTED ng/mg{creat}
BARBITURATES IA: NEGATIVE ng/mL
BUPRENORPHINE: NEGATIVE
Benzodiazepines: NEGATIVE
Buprenorphine: NOT DETECTED ng/mg{creat}
Clonazepam: NOT DETECTED ng/mg{creat}
Creatinine: 168 mg/dL (ref >=20)
Desalkylflurazepam: NOT DETECTED ng/mg{creat}
Desmethyldiazepam: NOT DETECTED ng/mg{creat}
Desmethylflunitrazepam: NOT DETECTED ng/mg{creat}
Diazepam: NOT DETECTED ng/mg{creat}
ETHYL ALCOHOL Enzymatic: NEGATIVE g/dL
FENTANYL: NEGATIVE
Fentanyl: NOT DETECTED ng/mg{creat}
Flunitrazepam: NOT DETECTED ng/mg{creat}
Lorazepam: NOT DETECTED ng/mg{creat}
METHADONE IA: NEGATIVE ng/mL
METHADONE MTB IA: NEGATIVE ng/mL
Midazolam: NOT DETECTED ng/mg{creat}
Norbuprenorphine: NOT DETECTED ng/mg{creat}
Norfentanyl: NOT DETECTED ng/mg{creat}
OPIATE CLASS IA: NEGATIVE ng/mL
OXYCODONE CLASS IA: NEGATIVE ng/mL
Oxazepam: NOT DETECTED ng/mg{creat}
PHENCYCLIDINE IA: NEGATIVE ng/mL
TAPENTADOL, IA: NEGATIVE ng/mL
TRAMADOL IA: NEGATIVE ng/mL
Temazepam: NOT DETECTED ng/mg{creat}

## 2023-10-21 LAB — COCAINE AND MTB, MS, UR RFX
Benzoylecgonine: >2976 ng/mg{creat}
Cocaethylene: NOT DETECTED ng/mg{creat}
Cocaine Confirmation: POSITIVE
Cocaine: 58 ng/mg{creat}

## 2023-10-21 LAB — CANNABINOIDS, MS, UR RFX
Cannabinoids Confirmation: POSITIVE
Carboxy-THC: 93 ng/mg{creat}

## 2023-10-24 DIAGNOSIS — A749 Chlamydial infection, unspecified: Secondary | ICD-10-CM | POA: Insufficient documentation

## 2023-10-24 LAB — CYTOLOGY - PAP
Chlamydia: POSITIVE — AB
Comment: NEGATIVE
Comment: NEGATIVE
Comment: NORMAL
Diagnosis: NEGATIVE
Diagnosis: REACTIVE
Neisseria Gonorrhea: NEGATIVE
Trichomonas: NEGATIVE

## 2023-10-25 NOTE — Telephone Encounter (Signed)
 Attempt to call patient to inform on results. No answer and LVM for patient to call back.   Will route to clinical pool for 2nd attempt  Faxed communicable disease report to health department.   Ermalinda GRADE CMA

## 2023-10-25 NOTE — Telephone Encounter (Signed)
-----   Message from Donnice CHRISTELLA Carolus sent at 10/24/2023 11:37 AM EDT ----- Pap normal, UDS consistent with history  +Chlamydia I've notified by MyChart and sent prescription to her pharmacy  Please do any necessary public health reporting paperwork ----- Message ----- From: Rebecka, [facility] [place] Sent: 10/18/2023   4:25 PM EDT To: Donnice CHRISTELLA Carolus, MD

## 2023-10-27 NOTE — Telephone Encounter (Addendum)
 Attempted to call patient back regarding results. Left VM for patient to call our office back regarding recent lab results.  Rosaline, RN ----- Message from Donnice CHRISTELLA Carolus sent at 10/24/2023 11:37 AM EDT ----- Pap normal, UDS consistent with history  +Chlamydia I've notified by MyChart and sent prescription to her pharmacy  Please do any necessary public health reporting paperwork ----- Message ----- From: Rebecka, [facility] [place] Sent: 10/18/2023   4:25 PM EDT To: Donnice CHRISTELLA Carolus, MD

## 2023-10-28 NOTE — Telephone Encounter (Signed)
 Called pt and informed her of test results with +Chlamydia. She stated that she is taking Doxycycline  once daily as prescribed for acne. Per consult with Dr. Lola, she should increase this medication to twice daily for 7 days, then resume once daily dosing for remainder of prescription. Her partner will require treatment as well and she should not have intercourse until one week after they both have been treated. She voiced understanding and had no questions.

## 2023-11-01 ENCOUNTER — Telehealth: Payer: Self-pay

## 2023-11-01 ENCOUNTER — Ambulatory Visit: Admitting: Advanced Practice Midwife

## 2023-11-01 NOTE — Telephone Encounter (Signed)
 Called patient and left VM offering to reschedule appt. MyChart message sent.

## 2023-12-15 ENCOUNTER — Telehealth: Payer: MEDICAID
